# Patient Record
Sex: Male | Born: 1955
Health system: Southern US, Community
[De-identification: ages and names within clinical notes are randomized; demographics above are authoritative.]

## PROBLEM LIST (undated history)

## (undated) DIAGNOSIS — I5020 Unspecified systolic (congestive) heart failure: Secondary | ICD-10-CM

## (undated) DIAGNOSIS — E119 Type 2 diabetes mellitus without complications: Secondary | ICD-10-CM

## (undated) DIAGNOSIS — I2510 Atherosclerotic heart disease of native coronary artery without angina pectoris: Secondary | ICD-10-CM

## (undated) DIAGNOSIS — F419 Anxiety disorder, unspecified: Secondary | ICD-10-CM

## (undated) DIAGNOSIS — I10 Essential (primary) hypertension: Secondary | ICD-10-CM

## (undated) DIAGNOSIS — E7800 Pure hypercholesterolemia, unspecified: Secondary | ICD-10-CM

## (undated) HISTORY — DX: Unspecified systolic (congestive) heart failure: I50.20

## (undated) HISTORY — DX: Pure hypercholesterolemia, unspecified: E78.00

## (undated) HISTORY — DX: Essential (primary) hypertension: I10

## (undated) HISTORY — PX: ARTERIOVENOUS GRAFT PLACEMENT W/ ENDOSCOPIC VEIN HARVEST: SUR1030

## (undated) HISTORY — DX: Morbid (severe) obesity due to excess calories: E66.01

## (undated) HISTORY — DX: Anxiety disorder, unspecified: F41.9

## (undated) HISTORY — DX: Type 2 diabetes mellitus without complications: E11.9

## (undated) HISTORY — DX: Atherosclerotic heart disease of native coronary artery without angina pectoris: I25.10

---

## 2005-02-01 DIAGNOSIS — I2510 Atherosclerotic heart disease of native coronary artery without angina pectoris: Secondary | ICD-10-CM

## 2005-02-01 HISTORY — PX: CORONARY ARTERY BYPASS GRAFT: SHX141

## 2005-02-01 HISTORY — DX: Atherosclerotic heart disease of native coronary artery without angina pectoris: I25.10

## 2005-10-24 ENCOUNTER — Inpatient Hospital Stay (HOSPITAL_COMMUNITY): Admission: EM | Admit: 2005-10-24 | Discharge: 2005-10-26 | Payer: Self-pay | Admitting: Emergency Medicine

## 2005-10-24 ENCOUNTER — Ambulatory Visit: Payer: Self-pay | Admitting: Cardiology

## 2005-10-27 ENCOUNTER — Inpatient Hospital Stay (HOSPITAL_COMMUNITY): Admission: EM | Admit: 2005-10-27 | Discharge: 2005-11-05 | Payer: Self-pay | Admitting: Emergency Medicine

## 2005-10-27 ENCOUNTER — Ambulatory Visit: Payer: Self-pay | Admitting: Internal Medicine

## 2005-10-28 ENCOUNTER — Encounter: Payer: Self-pay | Admitting: Vascular Surgery

## 2005-11-12 ENCOUNTER — Ambulatory Visit: Payer: Self-pay | Admitting: Cardiology

## 2005-11-25 ENCOUNTER — Encounter (HOSPITAL_COMMUNITY): Admission: RE | Admit: 2005-11-25 | Discharge: 2006-02-23 | Payer: Self-pay | Admitting: Cardiology

## 2005-12-08 ENCOUNTER — Ambulatory Visit: Payer: Self-pay | Admitting: Cardiology

## 2006-02-24 ENCOUNTER — Encounter (HOSPITAL_COMMUNITY): Admission: RE | Admit: 2006-02-24 | Discharge: 2006-05-17 | Payer: Self-pay | Admitting: Cardiology

## 2006-03-11 ENCOUNTER — Ambulatory Visit: Payer: Self-pay | Admitting: Cardiology

## 2006-09-22 ENCOUNTER — Ambulatory Visit: Payer: Self-pay | Admitting: Cardiovascular Disease

## 2007-03-06 ENCOUNTER — Ambulatory Visit: Payer: Self-pay | Admitting: Cardiovascular Disease

## 2008-03-11 ENCOUNTER — Ambulatory Visit: Payer: Self-pay | Admitting: Cardiovascular Disease

## 2008-10-10 DIAGNOSIS — I2510 Atherosclerotic heart disease of native coronary artery without angina pectoris: Secondary | ICD-10-CM | POA: Insufficient documentation

## 2008-10-10 DIAGNOSIS — R079 Chest pain, unspecified: Secondary | ICD-10-CM | POA: Insufficient documentation

## 2008-10-10 DIAGNOSIS — E119 Type 2 diabetes mellitus without complications: Secondary | ICD-10-CM | POA: Insufficient documentation

## 2008-10-10 DIAGNOSIS — I10 Essential (primary) hypertension: Secondary | ICD-10-CM | POA: Insufficient documentation

## 2008-10-10 DIAGNOSIS — F411 Generalized anxiety disorder: Secondary | ICD-10-CM | POA: Insufficient documentation

## 2008-10-10 DIAGNOSIS — E7800 Pure hypercholesterolemia, unspecified: Secondary | ICD-10-CM | POA: Insufficient documentation

## 2008-10-11 ENCOUNTER — Ambulatory Visit: Payer: Self-pay | Admitting: Cardiovascular Disease

## 2009-11-06 ENCOUNTER — Ambulatory Visit: Payer: Self-pay | Admitting: Cardiovascular Disease

## 2010-03-03 NOTE — Assessment & Plan Note (Signed)
Summary: yearly/sl   History of Present Illness: Justin Gonzales is seen for F/U of CAD.  He has a previous stent to the RCA and subsequent CABG in 2007 He has not had any recurrent SSCP or SOB.  He has normal LV funciton.  He was asking for nitrospray instead of the pills.  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  He is very well educated and has a business degree from [place].  His psoriasis continues to be horrific and stopping his Plavix has not helped.  He has not had any SSCP, palpitations, SOB, edema or syncope.  He has been compliant with his meds  His BS has been suboptimally controlled due to dietary indiscretion. Since he has brittle DM I think it wojuld be good to do a myovue next year even if he has no symptoms  Current Problems (verified): 1)  Hypertension  (ICD-401.9) 2)  Chest Pain  (ICD-786.50) 3)  Cad  (ICD-414.00) 4)  Hypercholesterolemia  (ICD-272.0) 5)  Dm  (ICD-250.00) 6)  Anxiety  (ICD-300.00)  Current Medications (verified): 1)  Lipitor 80 Mg Tabs (Atorvastatin Calcium) .... Take 1 Tablet By Mouth At Bedtime 2)  Metoprolol Succinate 50 Mg Xr24h-Tab (Metoprolol Succinate) .Marland Kitchen.. 1 Tab By Mouth Once Daily 3)  Novolog Flexpen 100 Unit/ml Soln (Insulin Aspart) .... 40 Units Once Daily 4)  Janumet 50-1000 Mg Tabs (Sitagliptin-Metformin Hcl) .Marland Kitchen.. 1 Tab By Mouth Two Times A Day 5)  Glimepiride 4 Mg Tabs (Glimepiride) .Marland Kitchen.. 1 Tab By Mouth Once Daily 6)  Fosinopril Sodium 40 Mg Tabs (Fosinopril Sodium) .Marland Kitchen.. 1 Tab By Mouth Two Times A Day 7)  Sertraline Hcl 100 Mg Tabs (Sertraline Hcl) .Marland Kitchen.. 1 Tab By Mouth Once Daily  Allergies (verified): No Known Drug Allergies  Past History:  Past Medical History: Last updated: 10/10/2008 Current Problems:  HYPERTENSION (ICD-401.9) CAD (ICD-414.00) stent to RCA and subsequent CABG 2007 HYPERCHOLESTEROLEMIA (ICD-272.0) DM (ICD-250.00) ANXIETY (ICD-300.00)  Past Surgical History: Last updated: 10/10/2008  Coronary artery bypass grafting  x4 2007:  [facility]   OPERATIVE PROCEDURE:  Median sternotomy, xtracorporeal circulation,coronary artery bypass graft surgery x4 using a left internal mammary artery graft to the left anterior descending coronary artery, with a saphenous vein graft to the obtuse marginal branch of the left circumflex coronary artery, and a sequential saphenous vein graft to the posterior descending and posterolateral branches of the right coronary artery.  Endoscopic vein harvesting from the right leg.  Family History: Last updated: 10/10/2008  Mother is 4 and has hypertension and diabetes.  Father  died from cancer.  He has two brothers who do not have coronary disease.  Social History: Last updated: 10/10/2008   He lives here in [place], [place].  He lives  with his wife.  He works at [employer].  He is very active in the  factory. He lifts and pushes things throughout the day.  He denies any  tobacco.  He drinks an occasional beer.  He denies any drug use.  He does  take herbal medications including multivitamin, B complex and some special  complex.  Review of Systems       Denies fever, malais, weight loss, blurry vision, decreased visual acuity, cough, sputum, SOB, hemoptysis, pleuritic pain, palpitaitons, heartburn, abdominal pain, melena, lower extremity edema, claudication, or rash.   Vital Signs:  Patient profile:   55 year old male Height:      74 inches Weight:      284 pounds BMI:  36.60 Pulse rate:   72 / minute Resp:     12 per minute BP sitting:   147 / 90  (left arm)  Vitals Entered By: Kem Parkinson (November 06, 2009 12:07 PM)  Physical Exam  General:  Affect appropriate Healthy:  appears stated age HEENT: normal Neck supple with no adenopathy JVP normal no bruits no thyromegaly Lungs clear with no wheezing and good diaphragmatic motion Heart:  S1/S2 no murmur,rub, gallop or click PMI normal Abdomen: benighn, BS positve, no tenderness, no AAA no bruit.   No HSM or HJR Distal pulses intact with no bruits No edema Neuro non-focal Skin with multiple psoriatic lesions    Impression & Recommendations:  Problem # 1:  HYPERTENSION (ICD-401.9)  Well controlled continue ACE in light of DM His updated medication list for this problem includes:    Metoprolol Succinate 50 Mg Xr24h-tab (Metoprolol succinate) .Marland Kitchen... 1 tab by mouth once daily    Fosinopril Sodium 40 Mg Tabs (Fosinopril sodium) .Marland Kitchen... 1 tab by mouth two times a day  His updated medication list for this problem includes:    Metoprolol Succinate 50 Mg Xr24h-tab (Metoprolol succinate) .Marland Kitchen... 1 tab by mouth once daily    Fosinopril Sodium 40 Mg Tabs (Fosinopril sodium) .Marland Kitchen... 1 tab by mouth two times a day  Problem # 2:  CAD (ICD-414.00)  Stable no angina.  Continue RF modificaiton His updated medication list for this problem includes:    Metoprolol Succinate 50 Mg Xr24h-tab (Metoprolol succinate) .Marland Kitchen... 1 tab by mouth once daily    Fosinopril Sodium 40 Mg Tabs (Fosinopril sodium) .Marland Kitchen... 1 tab by mouth two times a day  His updated medication list for this problem includes:    Metoprolol Succinate 50 Mg Xr24h-tab (Metoprolol succinate) .Marland Kitchen... 1 tab by mouth once daily    Fosinopril Sodium 40 Mg Tabs (Fosinopril sodium) .Marland Kitchen... 1 tab by mouth two times a day  Orders: EKG w/ Interpretation (93000)  Problem # 3:  HYPERCHOLESTEROLEMIA (ICD-272.0) Continue high dose statin Labs per primary His updated medication list for this problem includes:    Lipitor 80 Mg Tabs (Atorvastatin calcium) .Marland Kitchen... Take 1 tablet by mouth at bedtime  Problem # 4:  DM (ICD-250.00) HbA1c quaterly.  F/U primary.  Needs continued dietary vigilance His updated medication list for this problem includes:    Novolog Flexpen 100 Unit/ml Soln (Insulin aspart) .Marland KitchenMarland KitchenMarland KitchenMarland Kitchen 40 units once daily    Janumet 50-1000 Mg Tabs (Sitagliptin-metformin hcl) .Marland Kitchen... 1 tab by mouth two times a day    Glimepiride 4 Mg Tabs (Glimepiride) .Marland Kitchen... 1  tab by mouth once daily    Fosinopril Sodium 40 Mg Tabs (Fosinopril sodium) .Marland Kitchen... 1 tab by mouth two times a day  Patient Instructions: 1)  Your physician recommends that you schedule a follow-up appointment in: YEAR WITH DR Eden Emms 2)  Your physician recommends that you continue on your current medications as directed. Please refer to the Current Medication list given to you today.   EKG Report  Procedure date:  11/06/2009  Findings:      NSR 72 LAE Otherwise normal

## 2010-04-24 ENCOUNTER — Encounter (INDEPENDENT_AMBULATORY_CARE_PROVIDER_SITE_OTHER): Payer: Self-pay | Admitting: *Deleted

## 2010-04-30 NOTE — Letter (Signed)
Summary: Referral - not able to see patient  [facility]  [address]   [place], [place] [address]   Phone: [phone]  Fax: [phone]    April 24, 2010   Re:   Justin Gonzales DOB:  August 25, 1955 MRN:   [id number]    Dear Dr. Nicholos Johns:  Thank you for your kind referral of the above patient.  We have attempted to schedule the recommended procedure colonoscopy but have not been able to schedule because:  _x__ The patient was not available by phone and/or has not returned our calls.  ___ The patient declined to schedule the procedure at this time.  We appreciate the referral and hope that we will have the opportunity to treat this patient in the future.    Sincerely,    [facility] [phone]

## 2010-06-16 NOTE — Assessment & Plan Note (Signed)
[facility]                            CARDIOLOGY OFFICE NOTE   BRODI, KARI                  MRN:          [id number]  DATE:03/06/2007                            DOB:          07-Feb-1955    Mr. Dufault is previously a patient of Dr. Geralynn Rile.  Status post  inferior wall MI with bypass surgery.  He has a bare metal stent in the  right coronary artery.  This was a bridge to bypass.  I believe this was  done in September 2007.   He has not any significant chest pain.  He does get occasional  exertional dyspnea appears functional.  I suspect it is from his weight  gain.  His sugars been somewhat suboptimally controlled.  I suspect his  primary care MD will start lentis insulin next.   REVIEW OF SYSTEMS:  Remarkable for no significant chest pain.  He has  had trace lower extremity edema.  He has been under a lot of stress.  He  is still unemployed.  When he had stressed, his psoriasis breaks out  again and he has had worsening of this on his back.  Otherwise negative.   Meds include Janumet 50/1000 b.i.d. Lipitor or 80 a day, aspirin a day,  lisinopril 40 a day, glyburide 4 the morning 2 at night, metoprolol 50 a  day.   EXAM:  Remarkable for an overweight elderly male, speaking with an  accent.  Affect is appropriate.  Weight 284, blood pressure is 140/80, pulse 18 regular, afebrile,  respiratory 14.  HEENT:  Unremarkable.  Carotids are without bruit, no lymphadenopathy, thyromegaly, JVP  elevation.  Lungs are clear diaphragmatic motion.  No wheezing.  He has multiple psoriatic patches on his back.  S1-S2 normal heart sounds.  PMI normal.  Sternotomy well healed.  ABDOMEN:  Benign.  Bowel sounds positive.  No AAA no tenderness, No  hepatosplenomegaly or hepatojugular reflux.  Distal pulse intact, no edema.  NEURO:  Nonfocal.  SKIN:  Warm and dry.  Status post endoscopic vein harvesting the right  leg.   IMPRESSION:  1.  Coronary disease, previous CABG.  No chest pain.  Continue aspirin      and beta blocker.  2. Hypercholesteremia setting of coronary disease.  Continue Lipitor      80 a day, lipid liver profile in 6 months.  3. Diabetes follow with Dr. Nicholos Johns.  Hemoglobin A1c quarterly.      Suspect given what his fasting blood sugars have been running 120-      130.  He will be on Lantus as next time I see him.  4. Hypertension currently well controlled.  Continue current dose of      lisinopril, low-salt diet.  5. Anxiety related to lack of work, p.r.n. Ativan previously given by      Dr. Samule Ohm.  6. Psoriatic rash.  The patient apparently has medicine that he likes      to use for it.  He      understands it is related to increased stress.  He will follow with  Dr. Salome Holmes, his dermatologist p.r.n.Marland Kitchen     Noralyn Pick. Eden Emms, MD, [facility]  Electronically Signed    PCN/MedQ  DD: 03/06/2007  DT: 03/06/2007  Job #: [id number]
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
## 2010-06-16 NOTE — Assessment & Plan Note (Signed)
[facility]                            CARDIOLOGY OFFICE NOTE   MILAM, ALLBAUGH                  MRN:          [id number]  DATE:03/11/2008                            DOB:          Oct 31, 1955    Mr. Besser seen today in followup.  He is status post previous bare-  metal stenting of the RCA with subsequent CABG by Dr. Laneta Simmers back in  2007.   He has been doing well.  When I last saw him, I stopped his Plavix.   Coronary risk factors include diabetes, hypertension,  hypercholesterolemia.  Since I last saw him, he has been placed on  insulin.  Unfortunately, after one of his insulin shots, he had a  horrendous outbreak of eczema and psoriasis.  He had it in the past, but  it was dormant for 2 years and now it is an extremely bad case and  covering his entire body.  He seems to be taking everything in his  stride. His blood sugar is better controlled on insulin.   He denies any significant chest pain, PND, or orthopnea.  His total  cholesterol has been under 150.   He would like to cut back on his Lipitor from 80 to 40.  He said that  his LFTs were mildly elevated when checked by his primary care MD.  I  think this is reasonable.   His review of systems otherwise negative.   He is allergic to PENICILLIN.   MEDICATIONS:  1. Janumet 50/1000 b.i.d.  2. Lipitor 80 a day.  3. Metoprolol 50 a day.  4. Aspirin a day.  5. Lisinopril 40 b.i.d.  6. Glyburide 5 mg a day.  7. Insulin 40 units.  8. Fish oil.  9. He also uses cider vinegar and garlic.   PHYSICAL EXAMINATION:  GENERAL:  Remarkable for a middle-eastern male in  no distress.  Affect is jovial.  VITAL SIGNS:  Blood pressure is 150/80, pulse 85 and regular, weight  284.  HEENT:  Unremarkable.  NECK:  Carotids are normal without bruit.  No lymphadenopathy or JVP  elevation.  LUNGS:  Clear.  Good diaphragmatic motion.  No wheezing.  CARDIAC:  S1, S2.  Normal heart sounds PMI  normal.  ABDOMEN:  Benign.  Bowel sounds positive.  No AAA, no tenderness, no  bruit, no hepatosplenomegaly, no hepatojugular reflux, or tenderness.  EXTREMITIES:  Distal pulses intact.  No edema.  NEURO:  Nonfocal.  No muscular weakness.  SKIN:  Multiple psoriatic patches particularly a large one on the right  rib cage area, both shins including an area on his scalp.   EKG shows an old inferior wall infarction with no acute changes.   IMPRESSION:  1. Coronary artery disease, previous stent to the right coronary      artery with subsequent coronary artery bypass grafting.  Continue      aspirin therapy.  2. Hypercholesterolemia.  Decrease Lipitor to 40 a day.  Followup      primary care MD for liver function tests.  3. Hypertension, currently well controlled.  Continue current dose of  lisinopril.  4. Diabetes.  Follow up primary care MD.  Hemoglobin A1c target 6.5 or      less, now on insulin.  5. Psoriasis.  Follow up with Dr. Terri Piedra.  I am not sure what else can      be done, but he certainly has a horrific case of psoriasis at this      point.  I do not know that any medicines are contributing to this,      but hopefully there may be some other treatment including UV light      treatment that may be helpful.     Noralyn Pick. Eden Emms, MD, [facility]  Electronically Signed    PCN/MedQ  DD: 03/11/2008  DT: 03/12/2008  Job #: [id number]
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
## 2010-06-19 NOTE — H&P (Signed)
Justin Gonzales, Justin Gonzales         ACCOUNT NO.:  [id number]   MEDICAL RECORD NO.:  [id number]          PATIENT TYPE:  INP   LOCATION:  3703                         FACILITY:  [facility]   PHYSICIAN:  Bevelyn Buckles. Bensimhon, MDDATE OF BIRTH:  26-Oct-1955   DATE OF ADMISSION:  10/27/2005  DATE OF DISCHARGE:                                HISTORY & PHYSICAL   PRIMARY CARE PHYSICIAN:  Dr. Georgianne Fick.   CARDIOLOGIST:  Dr. Randa Evens.   REASON FOR ADMISSION:  Dyspnea and back pain, question unstable angina.   Justin Gonzales is a very pleasant 55 year old male with a history of diabetes  and hypertension.  He was admitted to [facility] on October 24, 2005 with  acute inferior ST elevation myocardial infarction.  He was taken urgently to  the cardiac catheterization lab by Dr. Samule Ohm.  This showed three-vessel  coronary artery disease with an EF of 60%.  Culprit lesion was 95%, mid  right coronary artery, which was angioplastied and stented with a bare-metal  stent with a plan for follow-up CABG.  He was discharged home yesterday  doing well.  However, this afternoon while at rest, he developed some  shortness of breath and back pain.  There was no chest pain.  He says with  his myocardial infarction he did have a significant amount of chest pain.  He was brought to the ER for further evaluation.  In the ER, EKG showed  inferior Q-waves with no acute ST-T wave changes.  Point of care markers had  a negative MB with a troponin of 0.8 which was likely residual from his  previous MI.  He is now symptom free.   REVIEW OF SYSTEMS:  Notable for arthritis pain.  He has not had any bright  red blood per rectum.  No melena.  No other bleeding.  No fevers.  No  chills.  No bowel or bladder changes.  He does have diabetes as stated  above.  Review of systems otherwise negative except for HPI and problem  list.   PAST MEDICAL HISTORY:  1. Coronary artery disease.      a.     Status post  inferior myocardial infarction on October 24, 2005       with PTCA and stenting with a bare-metal stent to 95% mid right       coronary artery.  The remainder of his catheterization showed 30%       ostial left main lesion.  LAD:  First 80% lesion beginning at the       ostium and continuing into the mid vessel crossing the second       diagonal.  The first diagonal had a 95% lesion in it.  Circumflex:       Was a moderate size vessel.  It gave rise to 2 marginals.  The first       marginal was totally occluded at the ostium and filled with left-to-       left collaterals.  RCA:  Had a 95% mid lesion which was stented.  The       PDA had a long 70  to 80% stenosis, and there was also 70% stenosis of       the posterior lateral branch.  EF was 60%.  2. Diabetes.  3. Hypertension.  4. Recent hand burn due to a gas incident at work.   CURRENT MEDICATIONS:  1. Plavix 75 a day.  2. Aspirin 325 a day.  3. Metformin 500 b.i.d.  4. Prinvil 10 mg a day.  5. Lipitor 80 mg a day.   ALLERGIES:  HE IS ALLERGIC TO PENICILLIN.   SOCIAL HISTORY:  He lives here in [place], [place].  He lives  with his wife.  He works at [employer].  He is very active in the  factory. He lifts and pushes things throughout the day.  He denies any  tobacco.  He drinks an occasional beer.  He denies any drug use.  He does  take herbal medications including multivitamin, B complex and some special  complex.   FAMILY HISTORY:  Mother is 84 and has hypertension and diabetes.  Father  died from cancer.  He has two brothers who do not have coronary disease.   PHYSICAL EXAMINATION:  GENERAL:  He is well-appearing in no acute distress.  Respirations are unlabored.  Blood pressure is 126/85 with a heart rate of  80, saturating 100% on room air.  HEENT:  Sclerae anicteric.  EOMI.  There is no xanthelasma.  Mucous  membranes are moist.  Neck is supple.  No JVD.  Carotid 2+ bilateral. No  bruits.  There is no  lymphadenopathy or thyromegaly.  CARDIAC:  He has a regular rate and rhythm.  No murmurs, rubs or gallops.  LUNGS:  Are clear.  ABDOMEN:  Is obese, nontender, nondistended. No hepatosplenomegaly.  No  bruits.  No masses.  Good bowel sounds.  EXTREMITIES:  Warm with no cyanosis, clubbing or edema.  Femoral pulses are  2+ bilaterally without any bruits.  Distal pulses are 2+.  NEUROLOGICAL:  He is alert and oriented x3.  Cranial nerves II-XII are  intact.  Moves all four extremities without difficulty.  Affect is bright.   EKG shows normal sinus rhythm at a rate of 89 with inferior Q-waves.  No  acute ST-T wave changes.  White count is 7.6, hemoglobin is 12.9, platelets  269.  Sodium 133, potassium 4.4, chloride 100, bicarb 26, BUN 14, creatinine  0.9, glucose 304.  Point care markers:  CK-MB is 2.0, troponin 0.83.   ASSESSMENT:  1. Dyspnea and back pain, question unstable angina.  2. Three-vessel coronary artery disease status post recent inferior      myocardial infarction as described above with pending bypass surgery.      EF of 60%.  3. Diabetes.  4. Hypertension.   PLAN/DISCUSSION:  I have discussed the plan with Dr. Samule Ohm.  We will admit  him for rule out myocardial infarction to telemetry.  We will hold Plavix  and asked CVTS to see for bypass surgery.  We will keep him in house on  Lovenox and aspirin until his bypass surgery.  He will be maintained on his  metformin as well as sliding scale insulin for his diabetes, and we will  continue his Lipitor.      Bevelyn Buckles. Bensimhon, MD  Electronically Signed     DRB/MEDQ  D:  10/27/2005  T:  10/29/2005  Job:  [id number]   cc:   Georgianne Fick, M.D.

## 2010-06-19 NOTE — Assessment & Plan Note (Signed)
[facility]                              CARDIOLOGY OFFICE NOTE   Justin Gonzales, Justin Gonzales                  MRN:          [id number]  DATE:12/08/2005                            DOB:          [birth year]    PRIMARY CARE PHYSICIAN:  Georgianne Fick, M.D.   HISTORY OF PRESENT ILLNESS:  Mr. Mehlhoff is a 55 year old gentleman who  suffered inferior myocardial infarction on October 24, 2005.  I placed a  bare-metal stent in his right coronary artery.  He also had severe multi-  vessel coronary disease for which he underwent subsequent coronary artery  bypass grafting by Dr. Laneta Simmers.  Presurgical ejection fraction was normal.  Postoperative course was uncomplicated.   Mr. Bakos is doing nicely postoperatively.  He is not having any  recurrent chest discomfort and is having no exertional dyspnea, PND or  orthopnea.  He says his energy is improving steadily and he is close to his  premorbid baseline.  The peri-incisional discomfort in his chest has  resolved.   His current medications are Janumet 50/1000 one b.i.d., glimepiride 4 mg  twice per day, Lipitor 80 mg once per day, Plavix 75 mg per day, aspirin 325  mg per day, Toprol XL 50 mg per day.   PHYSICAL EXAM:  He is generally well-appearing in no distress with a heart  rate of 80, blood pressure 132/74 and weight of 275 pounds.  Weight is up 7  pounds from early October.  He has no jugular venous distention.  No  thyromegaly.  Lungs are clear to auscultation.  He has nondisplaced point of  maximal cardiac impulse.  There is a regular rate and rhythm without murmur,  rub or gallop.  Mediastinotomy scar is nicely healed.  The abdomen is soft,  nondistended, nontender.  No hepatosplenomegaly.  Bowel sounds are normal.  The extremities are warm and without edema.   IMPRESSION/RECOMMENDATIONS:  1. Coronary disease with prior inferior myocardial infarction:  Doing      nicely after bare-metal  stenting to the right coronary artery and      subsequent coronary artery bypass graft.  Ejection fraction normal.      Continue Toprol XL.  Continue aspirin indefinitely and Plavix for a      total of nine months.  2. Hypertension:  Pretty good control.  He has not restarted the      fosinopril that I had recommended.  We will resume that.  This is      emphasized to him that this is particularly important for protection of      his kidneys in the setting of his diabetes.  3. Diabetes mellitus:  Per Dr. Georgianne Fick.  4. Hypercholesterolemia:  Per Dr. Nicholos Johns.  Goal LDL less than 70.   I will plan on seeing him back in three months' time.     Salvadore Farber, MD  Electronically Signed    WED/MedQ  DD: 12/08/2005  DT: 12/08/2005  Job #: [id number]   cc:   Georgianne Fick, M.D.

## 2010-06-19 NOTE — Discharge Summary (Signed)
Justin Gonzales, Justin Gonzales NO.:  [id number]   MEDICAL RECORD NO.:  [id number]          PATIENT TYPE:  INP   LOCATION:  2032                         FACILITY:  [facility]   PHYSICIAN:  Evelene Croon, M.D.     DATE OF BIRTH:  Jul 24, 1955   DATE OF ADMISSION:  10/27/2005  DATE OF DISCHARGE:  11/04/2005                                 DISCHARGE SUMMARY   PRIMARY DIAGNOSIS:  Severe 3-vessel coronary artery disease status post  acute inferior myocardial infarction.   HOSPITAL DIAGNOSIS:  Postoperative volume overload.   SECONDARY DIAGNOSES:  1. Diabetes mellitus.  2. Hypertension.  3. Recent hand burn due to a gas incident at work.  4. Coronary artery disease status post inferior myocardial infarction on      October 24, 2005 with percutaneous transluminal coronary angioplasty      and stenting with a bare metal stent to 95% mid-right coronary artery.   ALLERGIES:  PENICILLIN.   OPERATIONS AND PROCEDURES:  Coronary artery bypass grafting x4 using a left  internal mammary artery graft to left anterior descending coronary artery,  saphenous vein graft to obtuse marginal branch of left circumflex coronary  artery, sequential saphenous vein graft to posterior descending and  posterior lateral branches of the right coronary artery.  Endoscopic vein  harvesting from right leg.   HISTORY AND PHYSICAL AND HOSPITAL COURSE:  The patient is a 55 year old  __________ who was admitted with an acute inferior myocardial infarction  about 1 week ago.  His peak CPK was 779 with an MB of 132 and troponin of  24.  Culprit was a 95% mid-right coronary stenosis in a large dominant  vessel.  This was treated with a bare metal stent by Dr. Samule Ohm.  The  patient also had severe 3-vessel disease with a long proximal 80% LAD  stenosis and diffuse distal LAD disease.  There was 100% obtuse marginal  occlusion with collaterals filling the vessel.  The right coronary also had  70% distal stenosis  and there was also about 70% stenosis of the proximal  portion of posterior descending artery.  Ejection fraction was seen to be  60%.  Initial plan was to keep him on Plavix for 2 weeks and then stop it  for a week and do coronary artery bypass grafting.  But the patient was  readmitted to [facility] with shortness of breath without chest  pain.  He ruled out for a myocardial infarction and had no acute EKG  changes.  The patient was seen and evaluated by Dr. Laneta Simmers.  It was felt  that the patient should remain in the hospital and Plavix should be stopped  and should be washed out for a 5 day period.  Dr. Laneta Simmers saw and evaluated  the patient.  He discussed this with the patient and then proceeding with  coronary artery bypass grafting.  He discussed risks and benefits.  The  patient acknowledged understanding and agreed to proceed.  The patient had  bilateral carotid duplex ultrasound done showing no significant ICA  stenosis.  He has had bilateral ABIs done which showed bilateral greater  than 1.  Surgery was scheduled for November 01, 2005.  For details of the  patient's past medical history and physical exam, please see dictated  history and physical.   The patient was taken to the operating room on November 01, 2005, where he  underwent coronary artery bypass grafting x4 using a left internal mammary  artery graft to left anterior descending coronary artery, saphenous vein  graft to obtuse marginal branch of the left circumflex coronary artery,  sequential saphenous vein graft to posterior descending fistula branches of  the right coronary artery.  Endoscopic vein harvesting from right leg was  done.  The patient tolerated this procedure well and was transferred up to  the Intensive Care Unit in stable condition.  Following surgery, the patient  was deemed to be hemodynamically stable.  He was extubated late evening  early morning following surgery.  The patient's postoperative  course was  pretty much unremarkable.  Chest tubes and strips were discontinued postop  day #1.  The patient was transferred out to 2000 postop day #1.  Vital signs  were monitored during his postoperative course and seemed to be stable.  He  was afebrile.  The patient was able to be weaned off oxygen sating greater  than 90% on room air.  The patient remained hemodynamically stable  postoperatively.  The last hemoglobin and hematocrit were 10.7 and 30 postop  day #2.  This remained stable.  The patient did develop volume overload  postoperatively and was started on diuretics.  He was back near his baseline  prior to discharge home.  The patient was out of bed ambulating well.  He  was tolerating a regular diet well.  No nausea, vomiting noted.  The patient  remained in normal sinus rhythm postoperatively.  Lungs are clear to  auscultation bilaterally.  Incisions are clean, dry and intact and healing  well.  The patient does have a history of diabetes mellitus and his blood  sugars were monitored.  Initially, he was placed on Lantus until tolerating  a regular diet well.  By postop day #4, the patient was continued on the  Amaryl and restarted on his Janumet.  Blood sugars remained stable and  Lantus was able to be discontinued.  The patient is tentatively ready for  discharge home postop day #5, November 05, 2005.   FOLLOW-UP APPOINTMENTS:  Our office will contact the patient with a follow-  up appointment with Dr. Laneta Simmers for in 3 weeks.  The patient will need to  call Dr. Melinda Crutch office to schedule follow-up appointment with him in 2  weeks.   ACTIVITY:  The patient was instructed no driving until released to do so, no  lifting over 10 pounds.  He is told to ambulate 3-4 times per day, progress  as tolerated.  Continue his breathing exercises.   INCISIONAL CARE:  The patient was told he is allowed to shower, washing his incisions using soap and water.  He is to contact the office if he  develops  any drainage or opening from any of his incision sites.   DISCHARGE DIET:  The patient was educated on diet to be low-fat, low-salt.   DISCHARGE MEDICATIONS:  1. Aspirin 325 mg daily.  2. Toprol XL 25 mg daily.  3. Lisinopril 10 mg daily.  4. Lipitor 80 mg at night.  5. Amaryl 4 mg daily.  6. Xanax 75 mg p.r.n. daily.  7. Plavix 75 mg daily.  8. Lasix 40 mg  daily x5 days.  9. Potassium chloride 20 mEq daily x5 days.  10.Janumet 50/1000 mg b.i.d.  11.Oxycodone 5 mg one to two tabs q.4-6h. p.r.n. pain.      Theda Belfast, [place]      Evelene Croon, M.D.  Electronically Signed    KMD/MEDQ  D:  11/04/2005  T:  11/05/2005  Job:  [id number]   cc:   Evelene Croon, M.D.  Salvadore Farber, MD

## 2010-06-19 NOTE — Cardiovascular Report (Signed)
Justin Gonzales, Justin Gonzales NO.:  [id number]   MEDICAL RECORD NO.:  [id number]          PATIENT TYPE:  INP   LOCATION:  2807                         FACILITY:  [facility]   PHYSICIAN:  Salvadore Farber, MD  DATE OF BIRTH:  03/15/1955   DATE OF PROCEDURE:  10/24/2005  DATE OF DISCHARGE:                              CARDIAC CATHETERIZATION   PROCEDURE:  1. Left heart catheterization.  2. Left ventriculography.  3. Coronary angiography.  4. Bare metal stent placement in the mid-right coronary artery.  5. Aspiration thrombectomy.   INDICATION:  Mr. Levester Fresh is a 55 year old gentleman with diabetes  mellitus.  He has been suffering exertional chest discomfort for several  months.  Tonight he awoke with substernal chest discomfort.  He activated  EMS.  Pain improved with aspirin and sublingual nitroglycerin en route.  Electrocardiogram demonstrated approximately 1 mm of ST elevation in lead  III alone.  Cath lab was activated via EMS.  On the patient's arrival to the  hospital, he had 1-2/10 substernal chest discomfort with no change in his  electrocardiogram.  We decided to proceed to catheterization and possible  percutaneous revascularization.   PROCEDURAL TECHNIQUE:  Informed consent was obtained.  Under 1% lidocaine  local anesthesia, a 6-French sheath was placed in the right common femoral  artery using the modified Seldinger technique.  Diagnostic angiography and  ventriculography were performed using JL-4, JR-4, and pigtail catheters.  This demonstrated multivessel coronary disease with culprit lesion being a  95% stenosis of the mid-right coronary artery.  Decision was made to proceed  to percutaneous revascularization of this culprit stenosis with subsequent  consideration of CABG versus multivessel PCI for his other stenoses.   Anticoagulation was initiated with heparin, double bolus eptifibatide, and  champion study drug.  ACT was confirmed to be greater than  200 seconds and  was maintained such.  I began with a 6-French JR-4 guide.  I advanced a  Prowater wire across the lesion with mild difficulty.  I was then unable to  advance a 3.0 mm Maverick balloon across the lesion.  There was poor guide  support.  I therefore withdrew the wire and guide and upsized the sheath to  7-French.  I then advanced a 7-French ART 4.0 guide over the wire, engaging  the ostium of the right coronary.  I readvanced the Prowater across the  lesion without difficulty.  I was then able to advance a 2.0 x 9 mm Maverick  across the lesion without difficulty.  I inflated it to 6 atmospheres.  I  then readvanced the 3.0 x 12 mm Maverick across the lesion and inflated it  to 6 atmospheres.  I then stented the lesion using a 3.5 x 16 mm Liberte  stent deployed at 16 atmospheres.  I then postdilated the proximal portion  of the stent using a 4.0 x 12 mm Quantum at 16 atmospheres.  There appeared  to be a thrombus just distal to the stented segment.  I advanced an  aspiration thrombectomy catheter through this region for a single pass.  This resolved the thrombus.  At that time, ACT was found  to be 191, and  additional heparin was given.  I then further postdilated the stent using a  4.5 x 12 mm Quantum at 16 atmospheres.  Final angiography demonstrated  approximately 10% residual stenosis, no dissection, and TIMI III flow to the  distal vasculature.   The arteriotomy was then closed using an 8-French AngioSeal device.  Complete hemostasis was obtained.  He was then transferred to the cardiac  intensive care unit in stable condition.   COMPLICATIONS:  None.   FINDINGS:  1. LV:  133/20/31.  EF 60% with apical akinesis and severe hypokinesis of      the entirety of the inferior wall.  2. Left main:  Diffuse mild disease with a 30% ostial stenosis.  3. LAD:  Fairly short vessel which does not reach the apex of the heart.      It gives rise to 3 small diagonals.  The first  diagonal has a 95%      stenosis.  There is a long segment of 80% stenosis of the LAD beginning      with the ostium and continuing across the second diagonal.  The distal      LAD has 80% stenosis diffusely.  4. Ramus intermedius:  Long with small diameter vessel without focal      stenosis.  5. Circumflex:  Moderate-sized vessel giving rise to 2 marginals.  The      first marginal is occluded at its ostium and fills via left-to-left      collaterals.  6. RCA:  Extremely large dominant vessel.  There was a 95% stenosis of the      midvessel.  This was stented to no residual.  There is a 70-80%      stenosis of the proximal PDA and a long 70% stenosis of the posterior      left ventricular branch.   IMPRESSION/RECOMMENDATION:  The patient has multivessel coronary disease.  The culprit lesion in the right coronary artery was treated successfully  treated with bare metal stenting.  He has been initiated on Plavix as part  of his champion study.  I will refer him to cardiac surgery for  consideration of coronary bypass grafting.  We will need to consider the  Plavix load administered considering timing of surgical revascularization.  We will treat him with aspirin, beta-blocker, ACE inhibitor, and continued  statin.      Salvadore Farber, MD  Electronically Signed     WED/MEDQ  D:  10/24/2005  T:  10/26/2005  Job:  [id number]   cc:   Georgianne Fick, M.D.

## 2010-06-19 NOTE — Assessment & Plan Note (Signed)
[facility]                              CARDIOLOGY OFFICE NOTE   Justin Gonzales, Justin Gonzales                  MRN:          [id number]  DATE:11/12/2005                            DOB:          [birth year]    HISTORY OF PRESENT ILLNESS:  Justin Gonzales is a 55 year old gentleman who  presented with inferior myocardial infarction on October 24, 2005.  I  placed a bare metal stent in his right coronary artery.  He also had severe  multivessel disease and subsequently underwent coronary artery bypass  grafting by Dr. Laneta Simmers.  Pre-surgical ejection fraction was normal.  Postoperative course was uncomplicated.  He has generally been doing well  since discharge.  He has resumed his ACE inhibitor on his own and increased  his beta blocker because his blood pressure had been running high.  His  angina has been completely cured.  He has not had any orthopnea or PND.  He  does have some mild peri-incisional discomfort in the chest and requested a  refill of his oxycodone for a few more days.   CURRENT MEDICATIONS:  1. Janumet 50/1000 one b.i.d.  2. Glimepiride 4 mg twice per day.  3. Lipitor 80 mg per day.  4. Toprol XL 25 mg, taking once or twice a day depending on his blood      pressure.  5. Plavix 75 mg per day.  6. Aspirin 325 mg per day.  7. Lisinopril 20 mg twice per day.   PHYSICAL EXAMINATION:  GENERAL:  Well appearing, in no acute distress.  VITAL SIGNS: Heart rate 101, blood pressure 150/95, weight 268 pounds.  He  assures me that blood pressures have generally been in the range of 122 to  135/80.  NECK:  No jugular venous distention, no thyromegaly.  CHEST: Median sternotomy scar is healing nicely.  There is an area of mild  erythema in the mid section of the incision.  There is no tenderness at this  site and no purulence.  Suture line remains intact .  LUNGS: Clear to auscultation.  Respiratory effort is normal.  CARDIAC:  He has  nondisplaced point of maximal cardiac impulse.  There is  regular rate and rhythm without murmur, rub, or gallop.  ABDOMEN: Soft, nondistended, nontender.  There is no hepatosplenomegaly.  Bowel sounds are normal.  EXTREMITIES: Warm without edema.  Saphenous vein harvest site looks good.   Sinus tachycardia at 100 beats per minute with borderline left atrial  enlargement and inferior Q waves.   IMPRESSION AND RECOMMENDATIONS:  1. Coronary disease: Doing nicely after inferior myocardial infarction      with bare metal stenting to the right coronary and subsequently      coronary artery bypass grafting.  Ejection fraction is preserved.  Will      increase Toprol XL to stable dose of 50 mg per day; continue aspirin      indefinitely and Plavix for a year.  2. Hypertension: Continue ACE inhibitor.  3. Diabetes mellitus: Per Dr. Nicholos Johns.  4. Hypercholesterolemia: Per Dr. Nicholos Johns.  Goal LDL less than 70.   I will plan  on seeing him back in a month's time.       Justin Farber, MD     WED/MedQ  DD:  11/12/2005  DT:  11/14/2005  Job #:  [id number]   cc:   Evelene Croon, M.D.  Georgianne Fick, M.D.

## 2010-06-19 NOTE — Assessment & Plan Note (Signed)
[facility]                            CARDIOLOGY OFFICE NOTE   Justin Gonzales, Justin Gonzales                  MRN:          [id number]  DATE:03/11/2006                            DOB:          July 22, 1955    PRIMARY CARE PHYSICIAN:  Dr. Georgianne Gonzales.   HISTORY OF PRESENT ILLNESS:  Justin Gonzales is a 55 year old gentleman  who suffered inferior myocardial infarction in September 2007.  I  bridged him to coronary artery bypass grafting by placing a bare metal  stent in his right coronary artery.  His ejection fraction is normal.  He has done nicely after his bypass surgery.  He is back at work now.  He passed his citizenship exam this week, about which he is quite proud.  Work is going well.  He does continue to complain of some anxiety and  request a refill on his Ativan to bridge him until his appointment with  Dr. Nicholos Gonzales.   CURRENT MEDICATIONS:  1. Janumet 50/1000 one b.i.d.  2. Glimepiride 4 mg twice per day.  3. Lipitor 80 mg per day.  4. Plavix 75 mg per day.  5. Aspirin 325 mg per day.  6. Toprol XL 50 mg per day.  7. Fosinopril 40 mg per day.  8. Ativan 1 mg once per day.   PHYSICAL EXAMINATION:  He was generally well-appearing, in no distress,  with heart rate 79, blood pressure 130/92, and weight of 267 pounds.  Weight is down 8 pounds from November.  He has no jugular venous distension, thyromegaly, or lymphadenopathy.  Lungs are clear to auscultation.  Respiratory effort is normal.  He has a nondisplaced point of maximal cardiac impulse.  There is a  regular rate and rhythm without murmur, rub, or gallop.  Median  sternotomy scar is nicely healed.  The abdomen is soft, nondistended, nontender.  There is  hepatosplenomegaly.  Bowel sounds are normal.  The extremities are warm and without edema.   Electrocardiogram demonstrates normal sinus rhythm with inferior  infarct.  No change from prior.    IMPRESSION/RECOMMENDATIONS:  1. Coronary disease, prior inferior infarct:  Doing nicely after bare      metal stenting in the right coronary artery, and subsequent bypass      grafting.  Ejection fraction normal.  Continue aspirin      indefinitely, and Plavix until June.  Continue angiotensin-      converting enzyme inhibitor and beta blocker.  2. Hypertension:  Elevated today.  However, patient tells me that his      blood pressure is usually 115-125 systolic with diastolics usually      in the 60s at home.  He checks it several times daily.  We will,      thus, make no changes in his medication.  3. Diabetes mellitus:  Per Dr. Nicholos Gonzales.  4. Hypercholesterolemia:  Per Dr. Nicholos Gonzales.  Goal LDL less than      70.  5. Anxiety:  Will refill his Ativan for 30 days only.  Will leave      further management of his anxiety to Dr. Nicholos Gonzales.  Justin Farber, MD  Electronically Signed    WED/MedQ  DD: 03/11/2006  DT: 03/11/2006  Job #: [id number]   cc:   Justin Gonzales, M.D.
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
## 2010-06-19 NOTE — Op Note (Signed)
Justin Gonzales, Justin Gonzales         ACCOUNT NO.:  [id number]   MEDICAL RECORD NO.:  [id number]          PATIENT TYPE:  INP   LOCATION:  2315                         FACILITY:  [facility]   PHYSICIAN:  Evelene Croon, M.D.     DATE OF BIRTH:  1955-02-26   DATE OF PROCEDURE:  11/01/2005  DATE OF DISCHARGE:                                 OPERATIVE REPORT   PREOPERATIVE DIAGNOSIS:  Severe three-vessel coronary artery disease status  post acute inferior myocardial infarction.   POSTOPERATIVE DIAGNOSIS:  Severe three-vessel coronary artery disease status  post acute inferior myocardial infarction.   OPERATIVE PROCEDURE:  Median sternotomy, extracorporeal circulation,  coronary artery bypass graft surgery x4 using a left internal mammary artery  graft to the left anterior descending coronary artery, with a saphenous vein  graft to the obtuse marginal branch of the left circumflex coronary artery,  and a sequential saphenous vein graft to the posterior descending and  posterolateral branches of the right coronary artery.  Endoscopic vein  harvesting from the right leg.   ATTENDING SURGEON:  Evelene Croon, M.D.   ASSISTANT:  Salvatore Decent. Cornelius Moras, M.D.   SECOND ASSISTANT:  Ammie Ferrier [facility]   ANESTHESIA:  General endotracheal.   CLINICAL HISTORY:  This patient is a 55 year old [place] gentleman who was  admitted with an acute inferior MI about 1 week ago.  His peak CPK was 779  with an MB 132 and troponin of 24.  The culprit was a 95% mid right coronary  stenosis in a large dominant vessel.  This was treated with a bare metal  stent by Dr. Randa Evens.  The patient also had severe three-vessel  disease with a long proximal 80% LAD stenosis and diffuse distal LAD  disease.  There was 100% obtuse marginal occlusion with collaterals filling  the vessel.  The right coronary artery also had 70% distal stenosis and  there was also about 70% stenosis of the proximal portion of the posterior  descending artery.  An ejection fraction was about 60%.  The initial plan  was to keep him on Plavix for 2 weeks and then stop it for a week and do  bypass surgery but he was readmitted with shortness of breath without chest  pain.  He ruled out for myocardial infarction and had no acute EKG changes.  It was felt that he should remain in hospital.  His Plavix was stopped and  he was treated with Integrilin.  After a 5-day period of Plavix washout he  was scheduled for bypass surgery.  I discussed the operative procedure with  the patient and his wife and daughter including alternatives, benefits, and  risks including bleeding, blood transfusion, infection, stroke, myocardial  infarction, graft failure, and death.  He understood and agreed to proceed.   OPERATIVE PROCEDURE:  The patient was taken to operating room, placed on  table in supine position.  After induction of general endotracheal  anesthesia a Foley catheter was placed in bladder in sterile technique.  Then the chest, abdomen and both lower extremities were prepped and draped  in usual sterile manner.  The chest was entered through  a median sternotomy  incision.  The pericardium opened in the midline.  Examination of heart  showed good ventricular contractility.  The ascending aorta had no palpable  plaques in it.   Then the left internal mammary artery was harvested from the chest wall as  pedicle graft.  This is a medium caliber vessel with excellent blood flow  through it.  At the same time segment of greater saphenous vein was  harvested from the right leg.  This was harvested endoscopically and was a  large caliber vein with good quality.   Then the patient was heparinized and when an adequate activated clotting  time was achieved.  The distal ascending aorta was cannulated using a 22-  French aortic cannula for arterial inflow.  Venous outflow was achieved  using a two-stage venous cannula through the right atrial  appendage.  An  antegrade cardioplegia and vent cannula was inserted in aortic root.   The patient placed on cardiopulmonary bypass and distal coronaries  identified.  The LAD was intramyocardial in its proximal half.  It exited  from the muscle at the junction of mid and distal third where it was heavily  diseased probably corresponding to the diseased distal segment noted on  angiogram.  This vessel was traced backwards into the muscle where it became  a medium size vessel with minimal disease in it.  The diagonal branches were  all small not graftable vessels.  The left circumflex system had a single  moderate-sized marginal branch that was diffusely diseased.  The right  coronary artery gave off a large posterior descending branch which had  fairly high takeoff and then a smaller second branch that was lying beneath  the posterior descending vein.  There was a large posterolateral branch.  There is evidence of previous inferior infarction with some scar present.   Then the aorta was crossclamped and 1000 mL of cold blood antegrade  cardioplegia was administered in the aortic root with quick arrest of the  heart.  Systemic hypothermia to 28 degrees centigrade and topical  hypothermia with iced saline was used.  A temperature probe placed in the  septum and insulating pad in the pericardium.   The first distal anastomosis was performed of posterior descending coronary.  The internal diameter was about 1.75 mm.  Conduit used was a segment of  greater saphenous vein and anastomosis performed in a sequential side-to-  side manner using continuous 7-0 Prolene suture.  Flow was noted through the  graft and was excellent.   The second distal anastomosis was performed to the posterolateral branch.  The internal diameter was also about 1.75 mm.  Conduit used was the same  segment of greater saphenous vein and anastomosis performed in a sequential end-to-side manner using continuous 7-0  Prolene suture.  Flow was noted  through the graft and was excellent.  Then dose of cardioplegia given down  vein graft and in the aortic root.   The third distal anastomosis was performed to the obtuse marginal branch.  The internal diameter was about 1.5 mm.  The conduit used was a second  segment of greater saphenous vein and the anastomosis performed in end-to-  side manner using continuous 7-0 Prolene suture.  Flow was noted through the  graft and was excellent.   Fourth distal anastomosis was performed to the midportion of the left  anterior descending coronary artery.  The internal diameter was about 1.75  mm.  The conduit used was the left internal  mammary graft and this was  brought through an opening in the left pericardium anterior to the phrenic  nerve.  It was anastomosed to the LAD in end-to-side manner using continuous  8-0 Prolene suture.  The pedicle was sutured to the epicardium with 6-0  Prolene sutures.  The patient rewarmed to 37 degrees centigrade.  With  crossclamp in place the two proximal vein graft anastomoses performed to  aortic root end-to-side manner using continuous 6-0 Prolene suture.  The  clamp was then removed from the mammary pedicle.  There is rapid rewarming  of the ventricular septum and return of spontaneous ventricular  fibrillation.  The crossclamp was removed with a time of 88 minutes.  The  patient defibrillated to sinus rhythm.   The proximal and distal anastomoses appeared hemostatic and line of grafts  satisfactory.  Graft markers placed around the proximal anastomoses.  Two  temporary right ventricular and right atrial pacing wires placed and brought  out through the skin.   The patient rewarmed 37 degrees centigrade, he was weaned from  cardiopulmonary bypass on no inotropic agents.  Total bypass time was 107  minutes.  Cardiac function appeared excellent.  Cardiac output of 5.5 liters  per minute.  Protamine was given and the venous  and aortic cannuli removed  without difficulty.  Hemostasis was achieved.  Three chest tubes were placed  with two in the posterior pericardium, one in left pleural space, and one in  anterior mediastinum.  The pericardium was reapproximated over the heart.  Sternum was closed with #6 stainless steel wires.  Fascia was closed with  continuous #1 Vicryl suture.  Subcutaneous tissue was closed with continuous  2-0 Vicryl and the skin with 3-0 Vicryl subcuticular closure.  Lower  extremity vein harvest site was closed in layers in similar manner.  Sponge,  needle and instrument counts correct according to scrub nurse.  Dry sterile  dressing applied over the incisions, around the chest tubes which were  hooked to Pleur-Evac suction.  The patient remained hemodynamically stable,  transferred to the SICU in guarded but stable condition.      Evelene Croon, M.D.  Electronically Signed     BB/MEDQ  D:  11/01/2005  T:  11/02/2005  Job:  [id number]  cc:   Salvadore Farber, MD

## 2010-10-30 ENCOUNTER — Encounter: Payer: Self-pay | Admitting: Cardiovascular Disease

## 2010-11-02 ENCOUNTER — Encounter: Payer: Self-pay | Admitting: Cardiovascular Disease

## 2010-11-02 ENCOUNTER — Ambulatory Visit (INDEPENDENT_AMBULATORY_CARE_PROVIDER_SITE_OTHER): Payer: BC Managed Care – PPO | Admitting: Cardiovascular Disease

## 2010-11-02 DIAGNOSIS — E7800 Pure hypercholesterolemia, unspecified: Secondary | ICD-10-CM

## 2010-11-02 DIAGNOSIS — E119 Type 2 diabetes mellitus without complications: Secondary | ICD-10-CM

## 2010-11-02 DIAGNOSIS — I10 Essential (primary) hypertension: Secondary | ICD-10-CM

## 2010-11-02 DIAGNOSIS — I2510 Atherosclerotic heart disease of native coronary artery without angina pectoris: Secondary | ICD-10-CM

## 2010-11-02 NOTE — Assessment & Plan Note (Signed)
Target A1C 6.5 or less with known CAD.  Low carb diet

## 2010-11-02 NOTE — Assessment & Plan Note (Signed)
Cholesterol is at goal.  Continue current dose of statin and diet Rx.  No myalgias or side effects.  F/U  LFT's in 6 months. No results found for this basename: LDLCALC             

## 2010-11-02 NOTE — Patient Instructions (Signed)
Your physician wants you to follow-up in: 1 year with Dr. Nishan.  You will receive a reminder letter in the mail two months in advance. If you don't receive a letter, please call our office to schedule the follow-up appointment.  

## 2010-11-02 NOTE — Assessment & Plan Note (Signed)
Stable with no angina and good activity level.  Continue medical Rx CABG in 2007

## 2010-11-02 NOTE — Progress Notes (Signed)
Justin Gonzales is seen for F/U of CAD. He has a previous stent to the RCA and subsequent CABG in 2007  He has not had any recurrent SSCP or SOB. He has normal LV funciton. He was asking for nitrospray instead of the pills. 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 He is very well educated and has a business degree from [place]. His psoriasis continues to be horrific and stopping his Plavix has not helped. He has not had any SSCP, palpitations, SOB, edema or syncope. He has been compliant with his meds His BS has been suboptimally controlled due to dietary indiscretion.  DM better than last year with insulin.     ROS: Denies fever, malais, weight loss, blurry vision, decreased visual acuity, cough, sputum, SOB, hemoptysis, pleuritic pain, palpitaitons, heartburn, abdominal pain, melena, lower extremity edema, claudication, or rash.  All other systems reviewed and negative  General: Affect appropriate Healthy:  appears stated age HEENT: normal Neck supple with no adenopathy JVP normal no bruits no thyromegaly Lungs clear with no wheezing and good diaphragmatic motion Heart:  S1/S2 no murmur,rub, gallop or click PMI normal Abdomen: benighn, BS positve, no tenderness, no AAA no bruit.  No HSM or HJR Distal pulses intact with no bruits No edema Neuro non-focal Skin warm and dry marked psoriasis over entire upper body No muscular weakness   Current Outpatient Prescriptions  Medication Sig Dispense Refill  . atorvastatin (LIPITOR) 80 MG tablet Take 80 mg by mouth daily.        . fosinopril (MONOPRIL) 40 MG tablet Take 40 mg by mouth daily.        Marland Kitchen glimepiride (AMARYL) 4 MG tablet Take 4 mg by mouth daily before breakfast.        . insulin aspart (NOVOLOG FLEXPEN) 100 UNIT/ML injection Inject 40 Units into the skin daily.        . metoprolol (TOPROL-XL) 50 MG 24 hr tablet Take 50 mg by mouth daily.        . nitroGLYCERIN (NITROLINGUAL) 0.4 MG/SPRAY spray Place 1 spray under the tongue every 5  (five) minutes as needed.        . sertraline (ZOLOFT) 100 MG tablet Take 100 mg by mouth daily.        . sitaGLIPtan-metformin (JANUMET) 50-1000 MG per tablet Take 1 tablet by mouth 2 (two) times daily with a meal.          Allergies  Penicillins  Electrocardiogram:  NSR 84 LAE otherwise normal ECG  Assessment and Plan

## 2010-11-02 NOTE — Assessment & Plan Note (Signed)
Well controlled.  Continue current medications and low sodium Dash type diet.    

## 2010-12-26 ENCOUNTER — Other Ambulatory Visit: Payer: Self-pay | Admitting: Cardiovascular Disease

## 2011-02-15 ENCOUNTER — Other Ambulatory Visit: Payer: Self-pay

## 2011-02-15 ENCOUNTER — Other Ambulatory Visit: Payer: Self-pay | Admitting: Cardiovascular Disease

## 2011-02-15 MED ORDER — METOPROLOL SUCCINATE ER 50 MG PO TB24: 50.0000 mg | ORAL_TABLET | Freq: Every day | ORAL | Status: DC

## 2011-11-08 ENCOUNTER — Other Ambulatory Visit: Payer: Self-pay | Admitting: *Deleted

## 2011-11-08 MED ORDER — NITROGLYCERIN 0.4 MG/SPRAY TL SOLN: 1.0000 | Status: DC | PRN

## 2012-01-17 ENCOUNTER — Other Ambulatory Visit: Payer: Self-pay | Admitting: Cardiovascular Disease

## 2012-01-17 MED ORDER — METOPROLOL SUCCINATE ER 50 MG PO TB24: 50.0000 mg | ORAL_TABLET | Freq: Every day | ORAL | Status: DC

## 2012-01-20 ENCOUNTER — Other Ambulatory Visit: Payer: Self-pay | Admitting: Cardiovascular Disease

## 2012-01-20 MED ORDER — ATORVASTATIN CALCIUM 80 MG PO TABS: 80.0000 mg | ORAL_TABLET | Freq: Every day | ORAL | Status: DC

## 2012-01-21 ENCOUNTER — Telehealth: Payer: Self-pay | Admitting: Cardiovascular Disease

## 2012-01-21 NOTE — Telephone Encounter (Signed)
Pt needs refill on metaprolol called into [facility] on [address] and they have a question regarding medication so she needs a call

## 2012-01-21 NOTE — Telephone Encounter (Signed)
spoke with pt she asked for more refill of metoprolol  till her appointment  i called it into the pharmacy

## 2012-02-22 ENCOUNTER — Ambulatory Visit (INDEPENDENT_AMBULATORY_CARE_PROVIDER_SITE_OTHER): Payer: PRIVATE HEALTH INSURANCE | Admitting: Cardiovascular Disease

## 2012-02-22 VITALS — BP 140/80 | HR 70 | Ht 73.0 in | Wt 304.0 lb

## 2012-02-22 DIAGNOSIS — R079 Chest pain, unspecified: Secondary | ICD-10-CM

## 2012-02-22 DIAGNOSIS — E119 Type 2 diabetes mellitus without complications: Secondary | ICD-10-CM

## 2012-02-22 DIAGNOSIS — I10 Essential (primary) hypertension: Secondary | ICD-10-CM

## 2012-02-22 DIAGNOSIS — I2510 Atherosclerotic heart disease of native coronary artery without angina pectoris: Secondary | ICD-10-CM

## 2012-02-22 DIAGNOSIS — I25810 Atherosclerosis of coronary artery bypass graft(s) without angina pectoris: Secondary | ICD-10-CM

## 2012-02-22 DIAGNOSIS — E7800 Pure hypercholesterolemia, unspecified: Secondary | ICD-10-CM

## 2012-02-22 MED ORDER — METOPROLOL SUCCINATE ER 50 MG PO TB24: 50.0000 mg | ORAL_TABLET | Freq: Every day | ORAL | Status: DC

## 2012-02-22 MED ORDER — ATORVASTATIN CALCIUM 80 MG PO TABS: 80.0000 mg | ORAL_TABLET | Freq: Every day | ORAL | Status: DC

## 2012-02-22 NOTE — Progress Notes (Signed)
Patient ID: Justin Gonzales, male   DOB: 11-22-1955, 57 y.o.   MRN: [id number] [place] is seen for F/U of CAD. He has a previous stent to the RCA and subsequent CABG in 2007  He has not had any recurrent SSCP or SOB. He has normal LV funciton. He was asking for nitrospray instead of the pills. 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 He is very well educated and has a business degree from [place]. His psoriasis continues to be horrific and stopping his Plavix has not helped. He has not had any , palpitations, SOB, edema or syncope. He has been compliant with his meds His BS has been suboptimally controlled due to dietary indiscretion. DM better than last year with insulin.   Having some exertional chest pain.  Radiates to right arm  Not all the time ntermitant.  Gained 10 lbs and has not worked out for 8 weeks No rest pain.  Has not taken nitro  Brother died unexpectedly  ROS: Denies fever, malais, weight loss, blurry vision, decreased visual acuity, cough, sputum, SOB, hemoptysis, pleuritic pain, palpitaitons, heartburn, abdominal pain, melena, lower extremity edema, claudication, or rash.  All other systems reviewed and negative  General: Affect appropriate Overweight [place] male HEENT: normal Neck supple with no adenopathy JVP normal no bruits no thyromegaly Lungs clear with no wheezing and good diaphragmatic motion Heart:  S1/S2 no murmur, no rub, gallop or click PMI normal Abdomen: benighn, BS positve, no tenderness, no AAA no bruit.  No HSM or HJR Distal pulses intact with no bruits No edema Neuro non-focal Skin warm and dry No muscular weakness   Current Outpatient Prescriptions  Medication Sig Dispense Refill  . atorvastatin (LIPITOR) 80 MG tablet Take 1 tablet (80 mg total) by mouth at bedtime.  30 tablet  1  . fosinopril (MONOPRIL) 40 MG tablet Take 40 mg by mouth daily.        Marland Kitchen glimepiride (AMARYL) 4 MG tablet Take 4 mg by mouth daily before breakfast.        .  insulin aspart (NOVOLOG FLEXPEN) 100 UNIT/ML injection Inject 40 Units into the skin daily.        . metoprolol succinate (TOPROL-XL) 50 MG 24 hr tablet Take 1 tablet (50 mg total) by mouth daily.  30 tablet  0  . nitroGLYCERIN (NITROLINGUAL) 0.4 MG/SPRAY spray Place 1 spray under the tongue every 5 (five) minutes as needed.  12 g  2  . sertraline (ZOLOFT) 100 MG tablet Take 100 mg by mouth daily.        . sitaGLIPtan-metformin (JANUMET) 50-1000 MG per tablet Take 1 tablet by mouth 2 (two) times daily with a meal.          Allergies  Penicillins  Electrocardiogram: NSR rate 70 normal ECG  Assessment and Plan

## 2012-02-22 NOTE — Assessment & Plan Note (Signed)
Cholesterol is at goal.  Continue current dose of statin and diet Rx.  No myalgias or side effects.  F/U  LFT's in 6 months. No results found for this basename: LDLCALC             

## 2012-02-22 NOTE — Patient Instructions (Signed)
Your physician wants you to follow-up in:   6 MONTHS WITH DR  Haywood Filler will receive a reminder letter in the mail two months in advance. If you don't receive a letter, please call our office to schedule the follow-up appointment. Your physician recommends that you continue on your current medications as directed. Please refer to the Current Medication list given to you today. Your physician has requested that you have en exercise stress myoview. For further information please visit [email or web address] Please follow instruction sheet, as given. DX CHEST PAIN

## 2012-02-22 NOTE — Assessment & Plan Note (Signed)
Discussed low carb diet.  Target hemoglobin A1c is 6.5 or less.  Continue current medications.  

## 2012-02-22 NOTE — Assessment & Plan Note (Signed)
Well controlled.  Continue current medications and low sodium Dash type diet.    

## 2012-02-22 NOTE — Assessment & Plan Note (Signed)
Known CAD new chest pain ECG ok F/U stress myovue

## 2012-02-29 ENCOUNTER — Ambulatory Visit (HOSPITAL_COMMUNITY): Payer: PRIVATE HEALTH INSURANCE | Attending: Internal Medicine | Admitting: Radiology

## 2012-02-29 VITALS — BP 138/88 | Ht 73.0 in | Wt 298.0 lb

## 2012-02-29 DIAGNOSIS — R079 Chest pain, unspecified: Secondary | ICD-10-CM

## 2012-02-29 DIAGNOSIS — E119 Type 2 diabetes mellitus without complications: Secondary | ICD-10-CM | POA: Insufficient documentation

## 2012-02-29 DIAGNOSIS — I10 Essential (primary) hypertension: Secondary | ICD-10-CM | POA: Insufficient documentation

## 2012-02-29 DIAGNOSIS — I25810 Atherosclerosis of coronary artery bypass graft(s) without angina pectoris: Secondary | ICD-10-CM

## 2012-02-29 MED ORDER — TECHNETIUM TC 99M SESTAMIBI GENERIC - CARDIOLITE
32.2000 | Freq: Once | INTRAVENOUS | Status: AC | PRN
  Administered 2012-02-29: 32.2 via INTRAVENOUS

## 2012-02-29 NOTE — Progress Notes (Signed)
[facility] SITE 3 NUCLEAR MED [address] [place], [place] [address] [phone]    Cardiology Nuclear Med Study  Justin Gonzales is a 57 y.o. male     MRN : [id number]     DOB: [birth year]  Procedure Date: 02/29/2012  Nuclear Med Background Indication for Stress Test:  Evaluation for Ischemia and Stent Patency History:  '07 MI: IWMI, Heart Cath: EF: 60% Stent:RCA, CABG Cardiac Risk Factors: Hypertension, IDDM Type 2 and Lipids  Symptoms:  Chest Pain   Nuclear Pre-Procedure Caffeine/Decaff Intake:  None> 12 hrs NPO After: 6:00pm   Lungs:  clear O2 Sat: 96% on room air. IV 0.9% NS with Angio Cath:  20g  IV Site: R Antecubital x 1, tolerated well IV Started by:  Irean Hong, RN  Chest Size (in):  56 Cup Size: n/a  Height: 6\' 1"  (1.854 m)  Weight:  298 lb (135.172 kg)  BMI:  Body mass index is 39.32 kg/(m^2). Tech Comments:  Took Toprol at 3:00am; FBS was 140 @ 6:20am per patient, no insulin or diabetic medication today    Nuclear Med Study 1 or 2 day study: 2 day  Stress Test Type:  Stress  Reading MD: Cassell Clement, MD  Order Authorizing Provider:  Charlton Haws, MD  Resting Radionuclide: Technetium 14m Sestamibi  Resting Radionuclide Dose: 32.7 mCi on 03/01/12   Stress Radionuclide:  Technetium 62m Sestamibi  Stress Radionuclide Dose: 32.2 mCi on 02/29/12           Stress Protocol Rest HR: 80 Stress HR: 144  Rest BP: 138/88 Stress BP: 185  Exercise Time (min): 9:31 METS: 10.90   Predicted Max HR: 165 bpm % Max HR: 87.27 bpm Rate Pressure Product: 10272    Dose of Adenosine (mg):  n/a Dose of Lexiscan: n/a mg  Dose of Atropine (mg): n/a Dose of Dobutamine: n/a mcg/kg/min (at max HR)  Stress Test Technologist: Milana Na, EMT-P  Nuclear Technologist:  Doyne Keel, CNMT     Rest Procedure:  Myocardial perfusion imaging was performed at rest 45 minutes following the intravenous administration of Technetium 29m Sestamibi. Rest ECG: NSR -  Normal EKG  Stress Procedure:  The patient exercised on the treadmill utilizing the Bruce Protocol for 9:31 minutes. The patient stopped due to fatigue and denied any chest pain and had rare pacs/pvcs.  Technetium 67m Sestamibi was injected at peak exercise and myocardial perfusion imaging was performed after a brief delay. Stress ECG: No significant change from baseline ECG  QPS Raw Data Images:  Normal; no motion artifact; normal heart/lung ratio. Stress Images:  There is decreased uptake in the inferior wall. Rest Images:  There is decreased uptake in the inferior wall. Subtraction (SDS):  There is a fixed inferior defect that is most consistent with diaphragmatic attenuation. Transient Ischemic Dilatation (Normal <1.22):  0.83 Lung/Heart Ratio (Normal <0.45):  0.47  Quantitative Gated Spect Images QGS EDV:  195 ml QGS ESV:  95 ml  Impression Exercise Capacity:  Good exercise capacity. BP Response:  Normal blood pressure response. Clinical Symptoms:  No significant symptoms noted. ECG Impression:  No significant ST segment change suggestive of ischemia. Comparison with Prior Nuclear Study: No images to compare  Overall Impression:  Low risk stress nuclear study. There is a small scar of moderate severity involving the basal inferior and mid-inferior segments.  There is no reversible ischemia.   LV Ejection Fraction: 51%.  LV Wall Motion:  Very minimal inferobasal hypokinesis  Cassell Clement

## 2012-03-01 ENCOUNTER — Ambulatory Visit (HOSPITAL_COMMUNITY): Payer: PRIVATE HEALTH INSURANCE | Attending: Cardiology | Admitting: Radiology

## 2012-03-01 DIAGNOSIS — R0989 Other specified symptoms and signs involving the circulatory and respiratory systems: Secondary | ICD-10-CM

## 2012-03-01 MED ORDER — TECHNETIUM TC 99M SESTAMIBI GENERIC - CARDIOLITE
32.7000 | Freq: Once | INTRAVENOUS | Status: AC | PRN
  Administered 2012-03-01: 32.7 via INTRAVENOUS

## 2012-03-03 ENCOUNTER — Telehealth: Payer: Self-pay | Admitting: Cardiovascular Disease

## 2012-03-03 NOTE — Telephone Encounter (Signed)
PT'S DAUGHTER AWARE OF MYOVIEW RESULTS ./CY 

## 2012-03-03 NOTE — Telephone Encounter (Signed)
New Problem:    Patient's daughter called returning your call about his Stress test results.  Please call back.

## 2012-08-31 ENCOUNTER — Ambulatory Visit (INDEPENDENT_AMBULATORY_CARE_PROVIDER_SITE_OTHER): Payer: PRIVATE HEALTH INSURANCE | Admitting: Cardiovascular Disease

## 2012-08-31 ENCOUNTER — Encounter: Payer: Self-pay | Admitting: Cardiovascular Disease

## 2012-08-31 VITALS — BP 142/68 | HR 72 | Ht 74.0 in | Wt 307.0 lb

## 2012-08-31 DIAGNOSIS — R609 Edema, unspecified: Secondary | ICD-10-CM

## 2012-08-31 DIAGNOSIS — I10 Essential (primary) hypertension: Secondary | ICD-10-CM

## 2012-08-31 DIAGNOSIS — E7800 Pure hypercholesterolemia, unspecified: Secondary | ICD-10-CM

## 2012-08-31 DIAGNOSIS — I2510 Atherosclerotic heart disease of native coronary artery without angina pectoris: Secondary | ICD-10-CM

## 2012-08-31 MED ORDER — HYDROCHLOROTHIAZIDE 12.5 MG PO CAPS: 12.5000 mg | ORAL_CAPSULE | ORAL | Status: DC | PRN

## 2012-08-31 NOTE — Assessment & Plan Note (Signed)
Cholesterol is at goal.  Continue current dose of statin and diet Rx.  No myalgias or side effects.  F/U  LFT's in 6 months. No results found for this basename: LDLCALC             

## 2012-08-31 NOTE — Assessment & Plan Note (Signed)
Stable with no angina and good activity level.  Continue medical Rx Low risk myouve 1/14

## 2012-08-31 NOTE — Assessment & Plan Note (Signed)
Well controlled.  Continue current medications and low sodium Dash type diet.    

## 2012-08-31 NOTE — Patient Instructions (Addendum)
Your physician wants you to follow-up in:   YEAR WITH DR Haywood Filler will receive a reminder letter in the mail two months in advance. If you don't receive a letter, please call our office to schedule the follow-up appointment. Your physician has recommended you make the following change in your medication: TAKE  HCTZ 12.5 MG AS NEEDED FOR  SWELLING

## 2012-08-31 NOTE — Assessment & Plan Note (Signed)
Dependant but can make LE eczema worse  PRN HCTZ called into [facility]

## 2012-08-31 NOTE — Progress Notes (Signed)
Patient ID: Justin Gonzales, male   DOB: [birth year], 56 y.o.   MRN: [id number] Justin Gonzales is seen for F/U of CAD. He has a previous stent to the RCA and subsequent CABG in 2007  He has not had any recurrent SSCP or SOB. He has normal LV funciton. He was asking for nitrospray instead of the pills. 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 He is very well educated and has a business degree from [place]. His psoriasis continues to be horrific and stopping his Plavix has not helped. He has not had any , palpitations, SOB, edema or syncope. He has been compliant with his meds His BS has been suboptimally controlled due to dietary indiscretion. DM better than last year with insulin.  Myovue done 1/14 low risk with old inferior scar and EF 51% no ishcemia  Psoriasis is much better with green tea extract  ROS: Denies fever, malais, weight loss, blurry vision, decreased visual acuity, cough, sputum, SOB, hemoptysis, pleuritic pain, palpitaitons, heartburn, abdominal pain, melena, lower extremity edema, claudication, or rash.  All other systems reviewed and negative  General: Affect appropriate Obese slavic male HEENT: normal Neck supple with no adenopathy JVP normal no bruits no thyromegaly Lungs clear with no wheezing and good diaphragmatic motion Heart:  S1/S2 no murmur, no rub, gallop or click PMI normal Abdomen: benighn, BS positve, no tenderness, no AAA no bruit.  No HSM or HJR Distal pulses intact with no bruits Plus one to two bilateral edema Neuro non-focal Skin warm and dry psoriatic patch on back and LLE No muscular weakness   Current Outpatient Prescriptions  Medication Sig Dispense Refill  . atorvastatin (LIPITOR) 80 MG tablet Take 1 tablet (80 mg total) by mouth at bedtime.  30 tablet  11  . fosinopril (MONOPRIL) 40 MG tablet Take 40 mg by mouth daily.        . insulin aspart (NOVOLOG FLEXPEN) 100 UNIT/ML injection Inject 40 Units into the skin daily.        . metoprolol succinate  (TOPROL-XL) 50 MG 24 hr tablet Take 1 tablet (50 mg total) by mouth daily.  30 tablet  11  . nitroGLYCERIN (NITROLINGUAL) 0.4 MG/SPRAY spray Place 1 spray under the tongue every 5 (five) minutes as needed.  12 g  2  . sertraline (ZOLOFT) 100 MG tablet Take 100 mg by mouth daily.        . sitaGLIPtan-metformin (JANUMET) 50-1000 MG per tablet Take 1 tablet by mouth 2 (two) times daily with a meal.         No current facility-administered medications for this visit.    Allergies  Penicillins  Electrocardiogram:  02/22/12  SR rate 70 normal   Assessment and Plan

## 2013-01-13 ENCOUNTER — Other Ambulatory Visit: Payer: Self-pay | Admitting: Internal Medicine

## 2013-01-15 ENCOUNTER — Other Ambulatory Visit: Payer: Self-pay

## 2013-01-15 MED ORDER — NITROGLYCERIN 0.4 MG/SPRAY TL SOLN: 1.0000 | Status: DC | PRN

## 2013-02-07 ENCOUNTER — Other Ambulatory Visit: Payer: Self-pay | Admitting: Cardiovascular Disease

## 2013-03-06 ENCOUNTER — Other Ambulatory Visit: Payer: Self-pay | Admitting: Cardiovascular Disease

## 2013-05-28 ENCOUNTER — Other Ambulatory Visit: Payer: Self-pay | Admitting: Cardiovascular Disease

## 2013-06-19 ENCOUNTER — Other Ambulatory Visit: Payer: Self-pay | Admitting: Cardiovascular Disease

## 2013-08-22 ENCOUNTER — Other Ambulatory Visit: Payer: Self-pay | Admitting: Cardiovascular Disease

## 2013-09-04 ENCOUNTER — Encounter: Payer: Self-pay | Admitting: Cardiovascular Disease

## 2013-09-04 ENCOUNTER — Ambulatory Visit (INDEPENDENT_AMBULATORY_CARE_PROVIDER_SITE_OTHER): Payer: PRIVATE HEALTH INSURANCE | Admitting: Cardiovascular Disease

## 2013-09-04 VITALS — BP 132/80 | HR 79 | Ht 74.0 in | Wt 310.8 lb

## 2013-09-04 DIAGNOSIS — I10 Essential (primary) hypertension: Secondary | ICD-10-CM

## 2013-09-04 DIAGNOSIS — E119 Type 2 diabetes mellitus without complications: Secondary | ICD-10-CM

## 2013-09-04 DIAGNOSIS — I2510 Atherosclerotic heart disease of native coronary artery without angina pectoris: Secondary | ICD-10-CM

## 2013-09-04 DIAGNOSIS — E7800 Pure hypercholesterolemia, unspecified: Secondary | ICD-10-CM

## 2013-09-04 NOTE — Assessment & Plan Note (Signed)
Cholesterol is at goal.  Continue current dose of statin and diet Rx.  No myalgias or side effects.  F/U  LFT's in 6 months. No results found for this basename: LDLCALC  Labs with primary            

## 2013-09-04 NOTE — Patient Instructions (Signed)
Your physician wants you to follow-up in: YEAR WITH DR NISHAN  You will receive a reminder letter in the mail two months in advance. If you don't receive a letter, please call our office to schedule the follow-up appointment.  Your physician recommends that you continue on your current medications as directed. Please refer to the Current Medication list given to you today. 

## 2013-09-04 NOTE — Assessment & Plan Note (Signed)
Stable with no angina and good activity level.  Continue medical Rx  

## 2013-09-04 NOTE — Assessment & Plan Note (Signed)
Discussed low carb diet.  Target hemoglobin A1c is 6.5 or less.  Continue current medications.  

## 2013-09-04 NOTE — Progress Notes (Signed)
Patient ID: Justin Gonzales, male   DOB: 1955/10/24, 58 y.o.   MRN: [id number] Justin Gonzales is seen for F/U of CAD. He has a previous stent to the RCA and subsequent CABG in 2007  He has not had any recurrent SSCP or SOB. He has normal LV funciton. He was asking for nitrospray instead of the pills. 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 His psoriasis continues to be horrific and stopping his Plavix has not helped. He has not had any , palpitations, SOB, edema or syncope. He has been compliant with his meds His BS has been suboptimally controlled due to dietary indiscretion. DM better than last year with insulin.  Myovue done 1/14 low risk with old inferior scar and EF 51% no ishcemia Psoriasis is much better with green tea extract    ROS: Denies fever, malais, weight loss, blurry vision, decreased visual acuity, cough, sputum, SOB, hemoptysis, pleuritic pain, palpitaitons, heartburn, abdominal pain, melena, lower extremity edema, claudication, or rash.  All other systems reviewed and negative  General: Affect appropriate [name]  HEENT: normal Neck supple with no adenopathy JVP normal no bruits no thyromegaly Lungs clear with no wheezing and good diaphragmatic motion Heart:  S1/S2 no murmur, no rub, gallop or click PMI normal Abdomen: benighn, BS positve, no tenderness, no AAA no bruit.  No HSM or HJR Distal pulses intact with no bruits No edema Neuro non-focal Skin warm and dry No muscular weakness   Current Outpatient Prescriptions  Medication Sig Dispense Refill  . atorvastatin (LIPITOR) 80 MG tablet TAKE ONE TABLET BY MOUTH AT BEDTIME  30 tablet  0  . fosinopril (MONOPRIL) 40 MG tablet Take 40 mg by mouth daily.        . hydrochlorothiazide (MICROZIDE) 12.5 MG capsule Take 1 capsule (12.5 mg total) by mouth as needed.  30 capsule  2  . insulin aspart (NOVOLOG FLEXPEN) 100 UNIT/ML injection Inject 40 Units  into the skin daily.        . metoprolol succinate (TOPROL-XL) 50 MG 24 hr tablet TAKE ONE TABLET BY MOUTH ONCE DAILY  30 tablet  0  . nitroGLYCERIN (NITROLINGUAL) 0.4 MG/SPRAY spray Place 1 spray under the tongue every 5 (five) minutes as needed.  12 g  2  . sertraline (ZOLOFT) 100 MG tablet Take 100 mg by mouth daily.        . sitaGLIPtan-metformin (JANUMET) 50-1000 MG per tablet Take 1 tablet by mouth 2 (two) times daily with a meal.         No current facility-administered medications for this visit.    Allergies  Penicillins  Electrocardiogram:  Sr rate 79 normal   Assessment and Plan

## 2013-09-04 NOTE — Assessment & Plan Note (Signed)
Well controlled.  Continue current medications and low sodium Dash type diet.    

## 2013-09-24 ENCOUNTER — Other Ambulatory Visit: Payer: Self-pay | Admitting: Cardiovascular Disease

## 2014-01-09 ENCOUNTER — Other Ambulatory Visit: Payer: Self-pay | Admitting: Otolaryngology

## 2014-01-09 DIAGNOSIS — H6520 Chronic serous otitis media, unspecified ear: Secondary | ICD-10-CM

## 2014-01-09 DIAGNOSIS — H60391 Other infective otitis externa, right ear: Secondary | ICD-10-CM

## 2014-01-09 DIAGNOSIS — H6691 Otitis media, unspecified, right ear: Secondary | ICD-10-CM

## 2014-01-09 DIAGNOSIS — H9213 Otorrhea, bilateral: Secondary | ICD-10-CM

## 2014-01-09 DIAGNOSIS — H6690 Otitis media, unspecified, unspecified ear: Secondary | ICD-10-CM

## 2014-01-10 ENCOUNTER — Ambulatory Visit
Admission: RE | Admit: 2014-01-10 | Discharge: 2014-01-10 | Disposition: A | Discharge status: Home / Self Care | Payer: PRIVATE HEALTH INSURANCE | Source: Ambulatory Visit | Attending: Otolaryngology | Admitting: Otolaryngology

## 2014-01-10 DIAGNOSIS — H6520 Chronic serous otitis media, unspecified ear: Secondary | ICD-10-CM

## 2014-01-10 DIAGNOSIS — H60391 Other infective otitis externa, right ear: Secondary | ICD-10-CM

## 2014-01-10 DIAGNOSIS — H9213 Otorrhea, bilateral: Secondary | ICD-10-CM

## 2014-01-10 DIAGNOSIS — H6690 Otitis media, unspecified, unspecified ear: Secondary | ICD-10-CM

## 2014-01-10 DIAGNOSIS — H6691 Otitis media, unspecified, right ear: Secondary | ICD-10-CM

## 2014-03-06 ENCOUNTER — Ambulatory Visit: Admit: 2014-03-06 | Payer: Self-pay | Admitting: Otolaryngology

## 2014-03-06 SURGERY — TYMPANOPLASTY, WITH MASTOIDECTOMY
Anesthesia: General | Site: Ear | Laterality: Right

## 2014-03-21 ENCOUNTER — Other Ambulatory Visit: Payer: Self-pay | Admitting: Cardiovascular Disease

## 2014-03-23 ENCOUNTER — Other Ambulatory Visit: Payer: Self-pay | Admitting: Cardiovascular Disease

## 2014-04-15 ENCOUNTER — Other Ambulatory Visit: Payer: Self-pay | Admitting: Internal Medicine

## 2014-04-17 ENCOUNTER — Other Ambulatory Visit: Payer: Self-pay

## 2014-04-17 MED ORDER — NITROGLYCERIN 0.4 MG/SPRAY TL SOLN: Status: DC

## 2014-09-18 ENCOUNTER — Other Ambulatory Visit: Payer: Self-pay | Admitting: Cardiovascular Disease

## 2014-10-21 ENCOUNTER — Other Ambulatory Visit: Payer: Self-pay | Admitting: Cardiovascular Disease

## 2014-11-22 ENCOUNTER — Other Ambulatory Visit: Payer: Self-pay | Admitting: Cardiovascular Disease

## 2014-12-19 ENCOUNTER — Other Ambulatory Visit: Payer: Self-pay | Admitting: Cardiovascular Disease

## 2015-01-18 ENCOUNTER — Other Ambulatory Visit: Payer: Self-pay | Admitting: Cardiovascular Disease

## 2015-02-28 NOTE — Progress Notes (Signed)
No show

## 2015-03-06 ENCOUNTER — Encounter: Payer: PRIVATE HEALTH INSURANCE | Admitting: Cardiovascular Disease

## 2015-03-07 ENCOUNTER — Encounter: Payer: Self-pay | Admitting: Cardiovascular Disease

## 2015-03-12 ENCOUNTER — Encounter: Payer: Self-pay | Admitting: Cardiovascular Disease

## 2015-03-12 NOTE — Progress Notes (Signed)
Patient ID: Justin Gonzales, male   DOB: [birth year], 60 y.o.   MRN: [id number]   [name] is seen for F/U of CAD. He has a previous stent to the RCA and subsequent CABG in 2007  He has not had any recurrent SSCP or SOB. He has normal LV funciton. He was asking for nitrospray instead of the pills. 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 His psoriasis continues to be horrific and stopping his Plavix has not helped. He has not had any , palpitations, SOB, edema or syncope. He has been compliant with his meds His BS has been suboptimally controlled due to dietary indiscretion. DM better than last year with insulin.   Myovue done 1/14 low risk with old inferior scar and EF 51% no ishcemia Psoriasis is much better with green tea extract  Having URI and sore throat    ROS: Denies fever, malais, weight loss, blurry vision, decreased visual acuity, cough, sputum, SOB, hemoptysis, pleuritic pain, palpitaitons, heartburn, abdominal pain, melena, lower extremity edema, claudication, or rash.  All other systems reviewed and negative  General: Affect appropriate Obese [place] male  HEENT: normal Neck supple with no adenopathy JVP normal no bruits no thyromegaly Lungs clear with no wheezing and good diaphragmatic motion Heart:  S1/S2 no murmur, no rub, gallop or click PMI normal Abdomen: benighn, BS positve, no tenderness, no AAA no bruit.  No HSM or HJR Distal pulses intact with no bruits No edema Neuro non-focal Skin warm and dry No muscular weakness   Current Outpatient Prescriptions  Medication Sig Dispense Refill  . atorvastatin (LIPITOR) 80 MG tablet Take 80 mg by mouth daily.    . fosinopril (MONOPRIL) 40 MG tablet Take 40 mg by mouth daily.      . hydrochlorothiazide (MICROZIDE) 12.5 MG capsule Take 12.5 mg by mouth daily.    . insulin aspart (NOVOLOG FLEXPEN) 100 UNIT/ML injection Inject 60 Units into the skin 2 (two)  times daily.     . metoprolol succinate (TOPROL-XL) 50 MG 24 hr tablet Take 50 mg by mouth daily. Take with or immediately following a meal.    . nitroGLYCERIN (NITROLINGUAL) 0.4 MG/SPRAY spray DISSOLVE ONE TABLET UNDER THE TONGUE EVERY 5 MINUTES AS NEEDED FOR CHEST PAIN.  DO NOT EXCEED A TOTAL OF 3 DOSES IN 15 MINUTES 0.4 g 5  . sertraline (ZOLOFT) 100 MG tablet Take 100 mg by mouth daily.      . sitaGLIPtan-metformin (JANUMET) 50-1000 MG per tablet Take 1 tablet by mouth 2 (two) times daily with a meal.       No current facility-administered medications for this visit.    Allergies  Penicillins  Electrocardiogram:   09/04/13   SR rate 79 normal   Assessment and Plan CAD/CABG:  2007  No angina compliant with meds non ischemic myovue 2014 Psoriasis:  Improved with green tea extract f/u dermatology HTN:  Well controlled.  Continue current medications and low sodium Dash type diet.   DM: Discussed low carb diet.  Target hemoglobin A1c is 6.5 or less.  Continue current medications. Chol:  On statin labs with primary  URI:  5 day dose pack Z pack written for f/u primary    [name]

## 2015-03-13 ENCOUNTER — Encounter: Payer: Self-pay | Admitting: Cardiovascular Disease

## 2015-03-13 ENCOUNTER — Ambulatory Visit (INDEPENDENT_AMBULATORY_CARE_PROVIDER_SITE_OTHER): Payer: PRIVATE HEALTH INSURANCE | Admitting: Cardiovascular Disease

## 2015-03-13 VITALS — BP 158/80 | HR 77 | Resp 18 | Ht 73.0 in | Wt 318.0 lb

## 2015-03-13 DIAGNOSIS — E7800 Pure hypercholesterolemia, unspecified: Secondary | ICD-10-CM

## 2015-03-13 MED ORDER — ATORVASTATIN CALCIUM 80 MG PO TABS: 80.0000 mg | ORAL_TABLET | Freq: Every day | ORAL | Status: DC

## 2015-03-13 MED ORDER — AZITHROMYCIN 250 MG PO TABS: ORAL_TABLET | ORAL | Status: DC

## 2015-03-13 NOTE — Patient Instructions (Addendum)
Medication Instructions:  Your physician has recommended you make the following change in your medication:  1-Take azithromycin as directed. Take 2 tablets by mouth first day and 1 tablet by mouth for 4 days.  Labwork: NONE  Testing/Procedures: NONE  Follow-Up: Your physician wants you to follow-up in: 6 months with Dr. Johnsie Cancel. You will receive a reminder letter in the mail two months in advance. If you don't receive a letter, please call our office to schedule the follow-up appointment.  If you need a refill on your cardiac medications before your next appointment, please call your pharmacy.

## 2015-03-24 ENCOUNTER — Other Ambulatory Visit: Payer: Self-pay | Admitting: Cardiovascular Disease

## 2015-03-25 MED ORDER — METOPROLOL SUCCINATE ER 50 MG PO TB24: 50.0000 mg | ORAL_TABLET | Freq: Every day | ORAL | Status: DC

## 2015-10-24 IMAGING — CT CT TEMPORAL BONES W/O CM
4 of 6 series · 16 of 30 positions shown, 18 images · non-contrast
Comparison: None.

CLINICAL DATA: Chronic right otitis media. Bilateral otorrhea.
Right ear hearing loss.

EXAM:
CT TEMPORAL BONES WITHOUT CONTRAST
TECHNIQUE: Axial and coronal plane CT imaging of the petrous temporal bones was
performed with thin-collimation image reconstruction. No intravenous
contrast was administered. Multiplanar CT image reconstructions were
also generated.

[Series 3: ax mag right · axial · 0.20mm/px · z∈[-52,+3]mm · 5 of 265 slices shown, 7 images]
[im 45/265  brain]
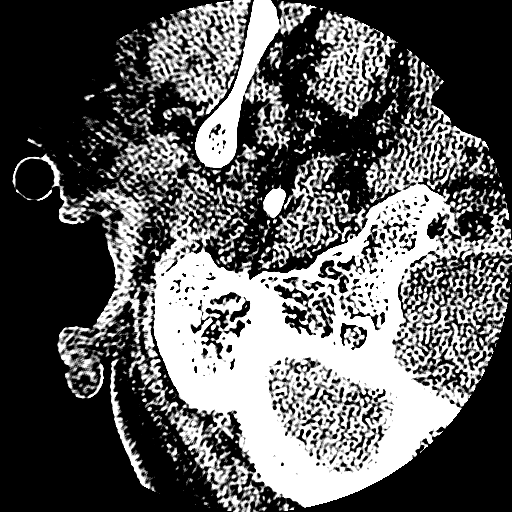
[im 45/265  bone]
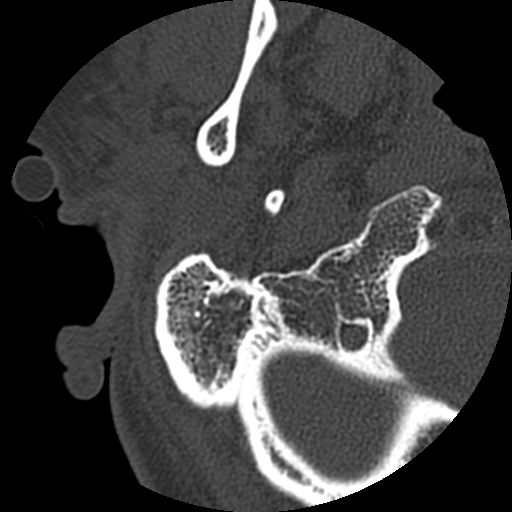
[im 89/265  bone]
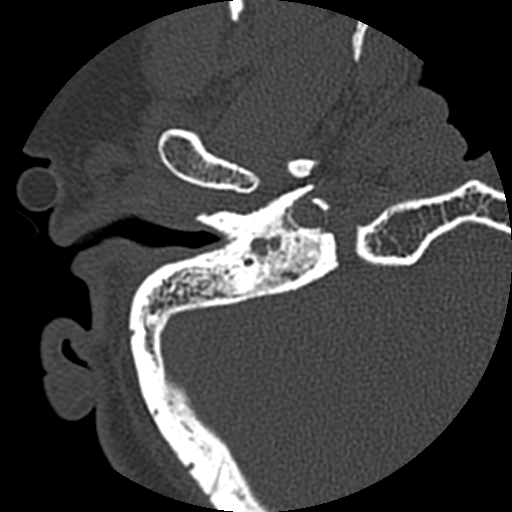
[im 133/265  bone]
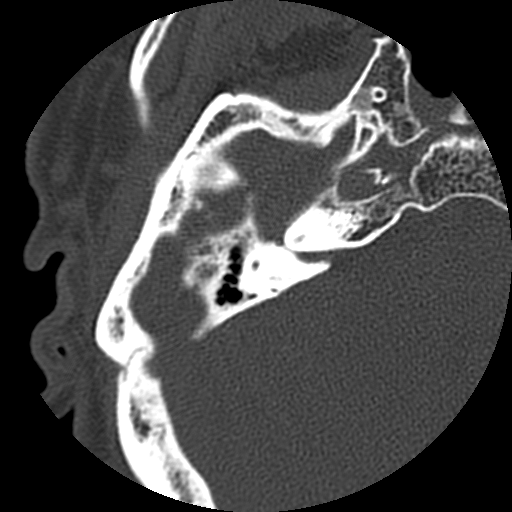
[im 177/265  bone]
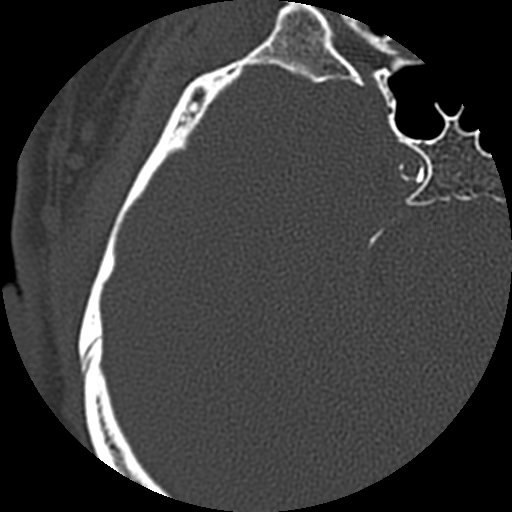
[im 221/265  brain]
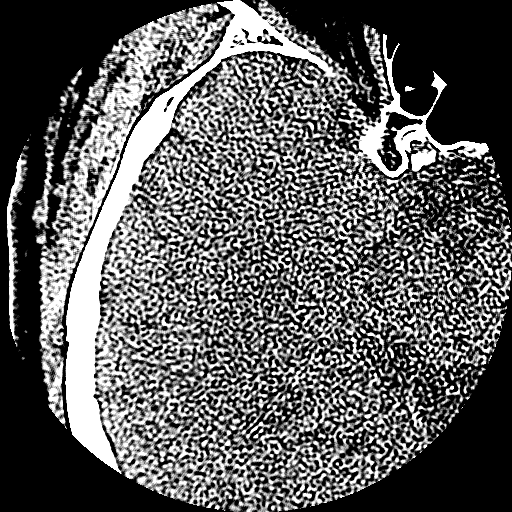
[im 221/265  bone]
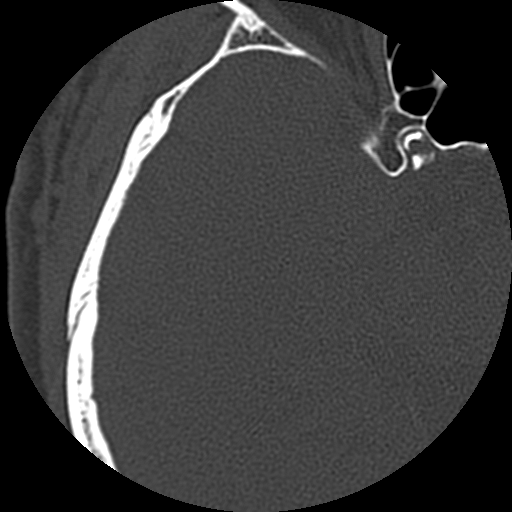

[Series 4: ax mag left · axial · 0.20mm/px · z∈[-49,+1]mm · 4 of 265 slices shown]
[im 53/265  bone]
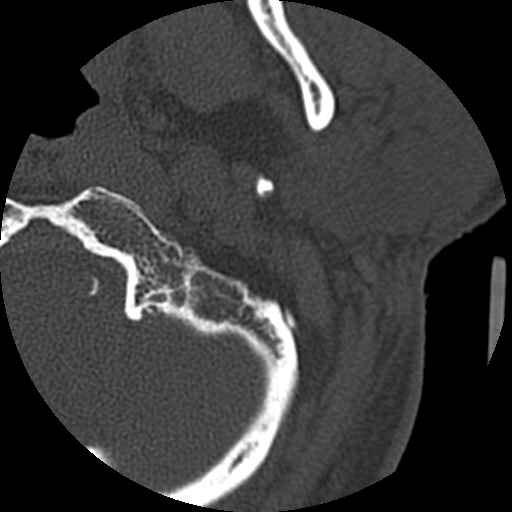
[im 106/265  bone]
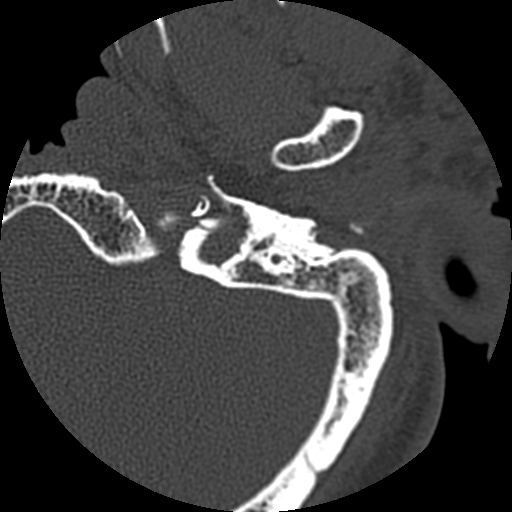
[im 159/265  bone]
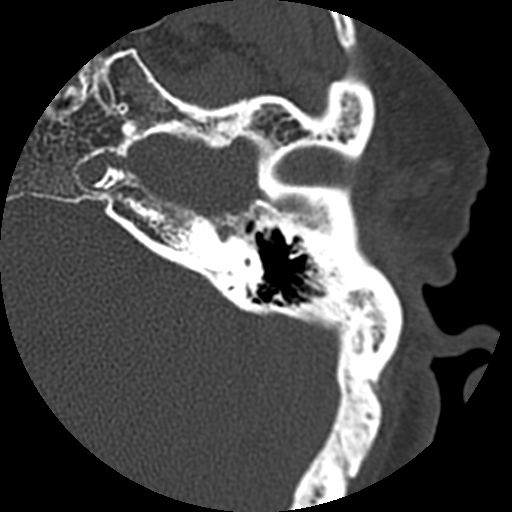
[im 212/265  bone]
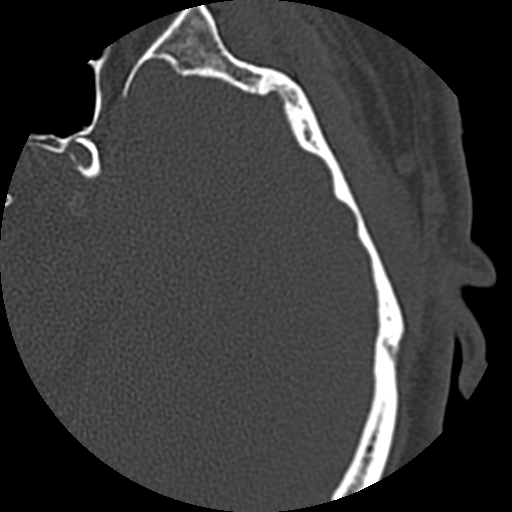

[Series 300: cor right mag · coronal · 0.20mm/px · 2 of 278 slices shown]
[im 56/278  bone]
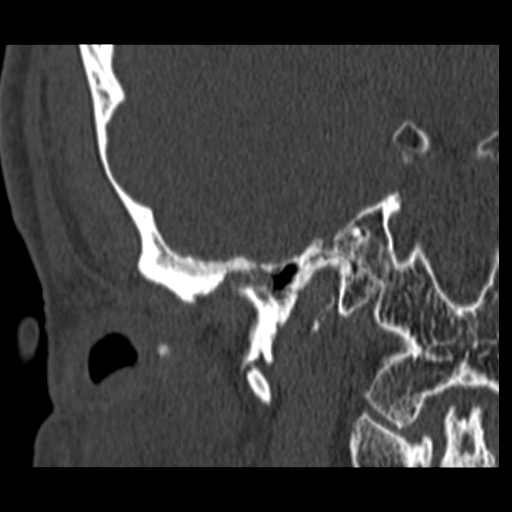
[im 111/278  bone]
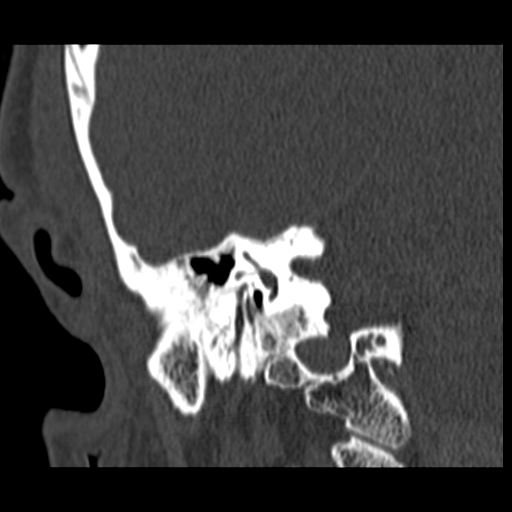

[Series 400: cor left mag · coronal · 0.20mm/px · 5 of 313 slices shown]
[im 53/313  bone]
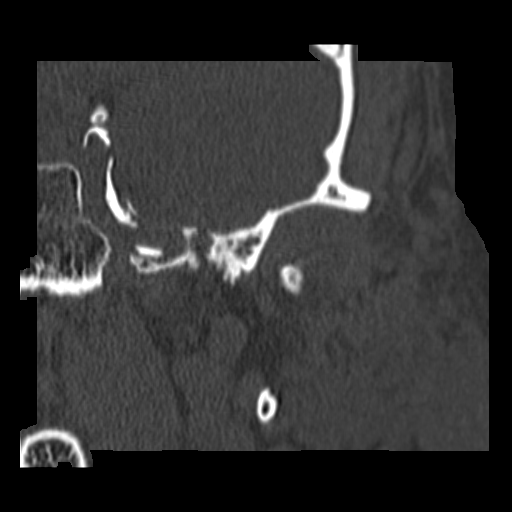
[im 105/313  bone]
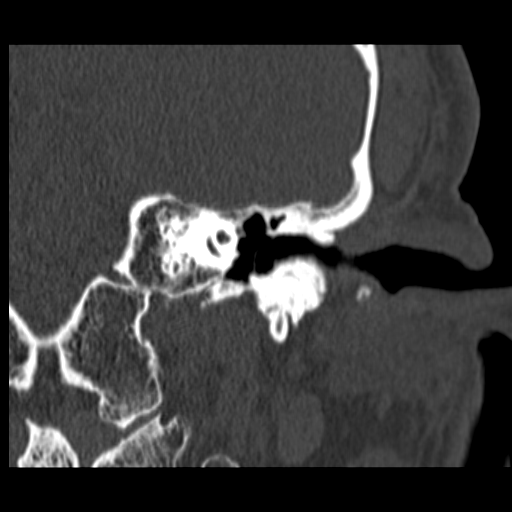
[im 157/313  bone]
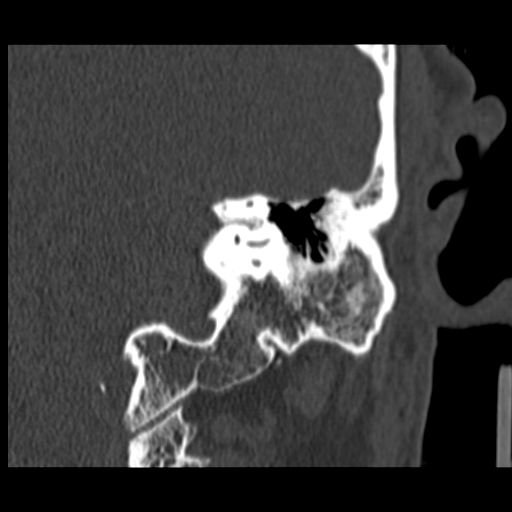
[im 209/313  bone]
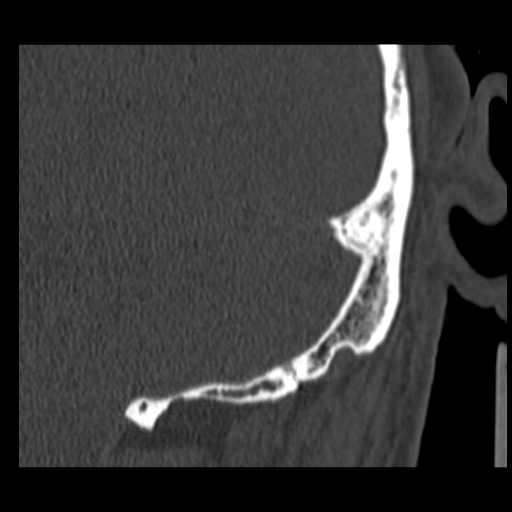
[im 261/313  bone]
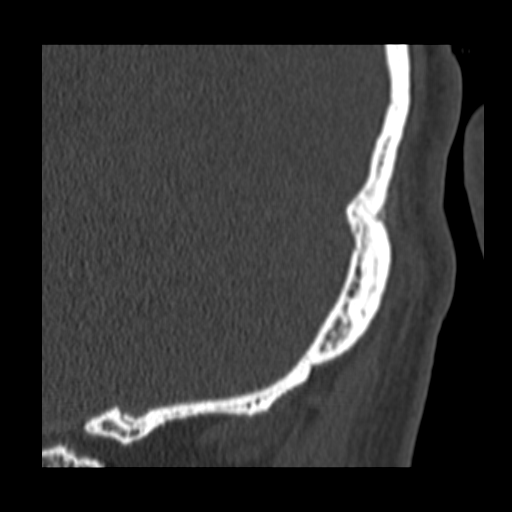

[16 of 30 positions shown; findings below may reference images not displayed]

FINDINGS: RIGHT: There is prominent soft tissue thickening in the medial
aspect of the external auditory canal extending to the tympanic
membrane and into the middle ear. Soft tissue involves Prussak's
space, and there is erosion of the scutum. Soft tissue is present in
the epitympanum and mesotympanum along the anterior aspect of the
malleus and there is partial erosion of the head of the malleus as
well as the manubrium. The incus appears intact. The stapes is also
grossly intact. Soft tissue extends into the hypotympanum and also
erodes through the anterior wall of the EAC into the posteromedial
aspect of the temporomandibular joint. The internal auditory canal,
cochlea, vestibule, and semicircular canals are unremarkable. There
is minimal mastoid pneumatization.

LEFT: The external auditory canal is unremarkable. Tympanic membrane
appears mildly thickened. The ossicles appear intact. The tympanic
cavity is clear. The internal auditory canal, cochlea, vestibule,
and semicircular canals are unremarkable. There is only a small
amount of mastoid pneumatization.

The visualized paranasal sinuses are clear. The visualized portion
of the brain is unremarkable. Visualized orbits are unremarkable.
There is extensive carotid siphon atherosclerotic calcification
bilaterally. Mild-to-moderate intracranial left vertebral artery
calcification is also noted.
IMPRESSION: 1. Abnormal soft tissue in the right middle ear and external
auditory canal with associated osseous erosion as above, including
of the malleus, consistent with cholesteatoma.
2. Mild thickening of the left tympanic membrane. Otherwise
unremarkable appearance of the left temporal bone.
3. Extensive, age advanced atherosclerosis.

## 2015-12-02 ENCOUNTER — Other Ambulatory Visit: Payer: Self-pay | Admitting: Cardiovascular Disease

## 2016-03-03 ENCOUNTER — Other Ambulatory Visit: Payer: Self-pay | Admitting: Cardiovascular Disease

## 2016-03-08 NOTE — Progress Notes (Signed)
Patient ID: Justin Gonzales, male   DOB: 11-Jun-1955, 61 y.o.   MRN: [id number]   Justin Gonzales is seen for F/U of CAD. He has a previous stent to the RCA and subsequent CABG in 2007  He has not had any recurrent SSCP or SOB. He has normal LV funciton. He was asking for nitrospray instead of the pills. 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 His psoriasis continues to be horrific and stopping his Plavix has not helped. He has not had any , palpitations, SOB, edema or syncope. He has been compliant with his meds His BS has been suboptimally controlled due to dietary indiscretion. DM better than last year with insulin.   Myovue done 1/14 low risk with old inferior scar and EF 51% no ishcemia  Psoriasis is much better with green tea extract  Has son in [place] and daughter in [place] back to [place] in July   ROS: Denies fever, malais, weight loss, blurry vision, decreased visual acuity, cough, sputum, SOB, hemoptysis, pleuritic pain, palpitaitons, heartburn, abdominal pain, melena, lower extremity edema, claudication, or rash.  All other systems reviewed and negative  General: Affect appropriate Obese [place] male  HEENT: normal Neck supple with no adenopathy JVP normal no bruits no thyromegaly Lungs clear with no wheezing and good diaphragmatic motion Heart:  S1/S2 no murmur, no rub, gallop or click PMI normal Abdomen: benighn, BS positve, no tenderness, no AAA no bruit.  No HSM or HJR Distal pulses intact with no bruits No edema Neuro non-focal Skin warm and dry No muscular weakness     Allergies  Penicillins  Electrocardiogram:   09/04/13   SR rate 79 normal  03/12/16 SR rate 73 ? Old IMI poor R wave progression   Assessment and Plan CAD/CABG:  2007  No angina compliant with meds non ischemic myovue 2014  Psoriasis:  Improved with green tea extract f/u dermatology  HTN:  Well controlled.  Continue  current medications and low sodium Dash type diet.   DM: Discussed low carb diet.  Target hemoglobin A1c is 6.5 or less.  Continue current medications.  Chol:  On statin labs with primary     [name]

## 2016-03-12 ENCOUNTER — Encounter: Payer: Self-pay | Admitting: Cardiovascular Disease

## 2016-03-12 ENCOUNTER — Ambulatory Visit (INDEPENDENT_AMBULATORY_CARE_PROVIDER_SITE_OTHER): Payer: PRIVATE HEALTH INSURANCE | Admitting: Cardiovascular Disease

## 2016-03-12 VITALS — BP 160/90 | HR 93 | Ht 72.0 in | Wt 317.1 lb

## 2016-03-12 DIAGNOSIS — E7800 Pure hypercholesterolemia, unspecified: Secondary | ICD-10-CM | POA: Diagnosis not present

## 2016-03-12 NOTE — Patient Instructions (Signed)
Medication Instructions:  Your physician recommends that you continue on your current medications as directed. Please refer to the Current Medication list given to you today.   Labwork: NONE  Testing/Procedures: NONE  Follow-Up: Your physician wants you to follow-up in: 6 months with Dr. Johnsie Cancel   Any Other Special Instructions Will Be Listed Below (If Applicable). NONE    If you need a refill on your cardiac medications before your next appointment, please call your pharmacy.

## 2016-03-30 ENCOUNTER — Other Ambulatory Visit: Payer: Self-pay | Admitting: Cardiovascular Disease

## 2016-07-26 ENCOUNTER — Other Ambulatory Visit: Payer: Self-pay | Admitting: Cardiovascular Disease

## 2016-07-27 ENCOUNTER — Other Ambulatory Visit: Payer: Self-pay | Admitting: Cardiovascular Disease

## 2016-07-28 NOTE — Telephone Encounter (Signed)
Medication Detail    Disp Refills Start End   nitroGLYCERIN (NITROLINGUAL) 0.4 MG/SPRAY spray 12 g 2 07/26/2016    Sig - Route: Place 1 spray under the tongue every 5 (five) minutes x 3 doses as needed for chest pain. - Sublingual   Notes to Pharmacy: Please consider 90 day supplies to promote better adherence   E-Prescribing Status: Receipt confirmed by pharmacy (07/26/2016 4:54 PM EDT)   Pharmacy   [place], [place].

## 2016-07-29 ENCOUNTER — Other Ambulatory Visit: Payer: Self-pay | Admitting: Cardiovascular Disease

## 2016-09-14 NOTE — Progress Notes (Signed)
Patient ID: Justin Gonzales, male   DOB: 1955/11/13, 61 y.o.   MRN: [id number]   Justin Gonzales is seen for F/U of CAD. He has a previous stent to the RCA and subsequent CABG in 2007  He has not had any recurrent SSCP or SOB. He has normal LV funciton. He was asking for nitrospray instead of the pills. 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 His psoriasis continues to be horrific and stopping his Plavix has not helped. He has not had any , palpitations, SOB, edema or syncope. He has been compliant with his meds His BS has been suboptimally controlled due to dietary indiscretion. DM better than last year with insulin.   Myovue done 1/14 low risk with old inferior scar and EF 51% no ishcemia  Psoriasis is much better with green tea extract  Has son in [place] and daughter in [place] trip back to [place] in July  I see him working out at the [employer] on occasion  Has filed for disability    ROS: Denies fever, malais, weight loss, blurry vision, decreased visual acuity, cough, sputum, SOB, hemoptysis, pleuritic pain, palpitaitons, heartburn, abdominal pain, melena, lower extremity edema, claudication, or rash.  All other systems reviewed and negative  General: Filed Weights   09/16/16 0948  Weight: (!) 311 lb 2 oz (141.1 kg)   BP 130/70   Pulse 86   Ht 6\' 1"  (1.854 m)   Wt (!) 311 lb 2 oz (141.1 kg)   SpO2 93%   BMI 41.05 kg/m  Affect appropriate Overweight [place] male  HEENT: normal Neck supple with no adenopathy JVP normal no bruits no thyromegaly Lungs clear with no wheezing and good diaphragmatic motion Heart:  S1/S2 no murmur, no rub, gallop or click PMI normal Abdomen: benighn, BS positve, no tenderness, no AAA no bruit.  No HSM or HJR Distal pulses intact with no bruits Plus one bilateral edema with stasis  Neuro non-focal Skin psoriatic patches on arms and leg  No muscular  weakness      Allergies  Penicillins  Electrocardiogram:   09/04/13   SR rate 79 normal  03/12/16 SR rate 73 ? Old IMI poor R wave progression   Assessment and Plan CAD/CABG:  2007  No angina compliant with meds non ischemic myovue 2014  Psoriasis:  Improved with green tea extract f/u dermatology  HTN:  Well controlled.  Continue current medications and low sodium Dash type diet.   DM: Discussed low carb diet.  Target hemoglobin A1c is 6.5 or less.  Continue current medications.  Chol:  On statin labs with primary   Edema: dependant from obesity and psoriasis lasix 10 mg called in he will have f/u labs with primary in a few weeks    [name]

## 2016-09-16 ENCOUNTER — Ambulatory Visit (INDEPENDENT_AMBULATORY_CARE_PROVIDER_SITE_OTHER): Payer: PRIVATE HEALTH INSURANCE | Admitting: Cardiovascular Disease

## 2016-09-16 ENCOUNTER — Encounter (INDEPENDENT_AMBULATORY_CARE_PROVIDER_SITE_OTHER): Payer: Self-pay

## 2016-09-16 ENCOUNTER — Encounter: Payer: Self-pay | Admitting: Cardiovascular Disease

## 2016-09-16 VITALS — BP 130/70 | HR 86 | Ht 73.0 in | Wt 311.1 lb

## 2016-09-16 DIAGNOSIS — Z951 Presence of aortocoronary bypass graft: Secondary | ICD-10-CM

## 2016-09-16 MED ORDER — FUROSEMIDE 20 MG PO TABS: 10.0000 mg | ORAL_TABLET | Freq: Every day | ORAL | 11 refills | Status: DC

## 2016-09-16 NOTE — Patient Instructions (Signed)
Medication Instructions:  1. START LASIX 10 MG DAILY (THIS WILL BE A 1/2 OF A 20 MG TABLET)  Labwork: NONE ORDERED  Testing/Procedures: NONE ORDERED  Follow-Up: Your physician wants you to follow-up in: [place] DR. Johnsie Cancel You will receive a reminder letter in the mail two months in advance. If you don't receive a letter, please call our office to schedule the follow-up appointment.   Any Other Special Instructions Will Be Listed Below (If Applicable).     If you need a refill on your cardiac medications before your next appointment, please call your pharmacy.

## 2017-03-31 NOTE — Progress Notes (Signed)
Patient ID: Justin Gonzales, male   DOB: Aug 21, 1955, 62 y.o.   MRN: [id number]   62 y.o. CAD stent RCA then CABG 2007 Normal EF From [place]. On insulin for DM. Does work out at the [employer]. No angina. Compliant with meds Last myovue 2014 non ischemic   Myovue done 1/14 low risk with old inferior scar and EF 51% no ishcemia  Has son in [place] and daughter in [place] trip back to [place]  Last  July  Has filed for disability has lumbago and had injection Concerned about Irbastatan recall  BP readings at home fine compliant with meds   ROS: Denies fever, malais, weight loss, blurry vision, decreased visual acuity, cough, sputum, SOB, hemoptysis, pleuritic pain, palpitaitons, heartburn, abdominal pain, melena, lower extremity edema, claudication, or rash.  All other systems reviewed and negative  General: Filed Weights   04/05/17 0856  Weight: (!) 319 lb 8 oz (144.9 kg)  BP (!) 176/88   Pulse 80   Ht 6\' 1"  (1.854 m)   Wt (!) 319 lb 8 oz (144.9 kg)   SpO2 99%   BMI 42.15 kg/m   Affect appropriate Overweight white male  HEENT: normal Neck supple with no adenopathy JVP normal no bruits no thyromegaly Lungs clear with no wheezing and good diaphragmatic motion Heart:  S1/S2 no murmur, no rub, gallop or click PMI normal Abdomen: benighn, BS positve, no tenderness, no AAA no bruit.  No HSM or HJR Distal pulses intact with no bruits Plus one bilateral  edema Neuro non-focal Skin psoriasis on back arms and legs  No muscular weakness       Allergies  Penicillins  Electrocardiogram:   09/04/13   SR rate 79 normal  03/12/16 SR rate 73 ? Old IMI poor R wave progression  04/05/17 SR rate 72 normal   Assessment and Plan CAD/CABG:  2007  No angina compliant with meds non ischemic myovue 2014  Psoriasis:  Improved with green tea extract f/u dermatology  HTN:  Home readings fine some white coat component change Avapro to Cozaar 100 mg   DM: Discussed low carb  diet.  Target hemoglobin A1c is 6.5 or less.  Continue current medications.  Chol:  On statin labs with primary   Edema: dependant from obesity better with lasix     [name]

## 2017-04-05 ENCOUNTER — Encounter: Payer: Self-pay | Admitting: Cardiovascular Disease

## 2017-04-05 ENCOUNTER — Ambulatory Visit: Payer: PRIVATE HEALTH INSURANCE | Admitting: Cardiovascular Disease

## 2017-04-05 VITALS — BP 150/80 | HR 80 | Ht 73.0 in | Wt 319.5 lb

## 2017-04-05 DIAGNOSIS — Z951 Presence of aortocoronary bypass graft: Secondary | ICD-10-CM

## 2017-04-05 DIAGNOSIS — I25810 Atherosclerosis of coronary artery bypass graft(s) without angina pectoris: Secondary | ICD-10-CM

## 2017-04-05 DIAGNOSIS — E7800 Pure hypercholesterolemia, unspecified: Secondary | ICD-10-CM

## 2017-04-05 DIAGNOSIS — I10 Essential (primary) hypertension: Secondary | ICD-10-CM

## 2017-04-05 MED ORDER — LOSARTAN POTASSIUM 100 MG PO TABS: 100.0000 mg | ORAL_TABLET | Freq: Every day | ORAL | 3 refills | Status: DC

## 2017-04-05 NOTE — Patient Instructions (Addendum)
Medication Instructions:  Your physician has recommended you make the following change in your medication:  1-STOP irbesartan 2-START losartan 100 mg by mouth daily.  Labwork: NONE  Testing/Procedures: NONE  Follow-Up: Your physician wants you to follow-up in: 12 months with Dr. Johnsie Cancel. You will receive a reminder letter in the mail two months in advance. If you don't receive a letter, please call our office to schedule the follow-up appointment.   If you need a refill on your cardiac medications before your next appointment, please call your pharmacy.

## 2017-05-06 ENCOUNTER — Other Ambulatory Visit: Payer: Self-pay | Admitting: Cardiovascular Disease

## 2017-06-14 ENCOUNTER — Other Ambulatory Visit: Payer: Self-pay | Admitting: Cardiovascular Disease

## 2017-10-25 ENCOUNTER — Other Ambulatory Visit: Payer: Self-pay | Admitting: Cardiovascular Disease

## 2018-02-14 ENCOUNTER — Other Ambulatory Visit: Payer: Self-pay | Admitting: Cardiovascular Disease

## 2018-03-21 ENCOUNTER — Encounter: Payer: Self-pay | Admitting: Cardiovascular Disease

## 2018-03-29 ENCOUNTER — Other Ambulatory Visit: Payer: Self-pay | Admitting: Cardiovascular Disease

## 2018-04-06 NOTE — Progress Notes (Signed)
Patient ID: Justin Gonzales, male   DOB: 09-Jun-1955, 63 y.o.   MRN: [id number]     63 y.o. CAD stent RCA then CABG 2007 Normal EF From [place]. On insulin for DM. Does work out at the [employer]. No angina. Compliant with meds Last myovue 2014 non ischemic   Myovue done 1/14 low risk with old inferior scar and EF 51% no ishcemia  Has son in [place] and daughter in [place] trip back to [place]  Last  July  Has filed for disability has lumbago and had injection  BP readings at home fine compliant with meds   My reading with large cuff 140 80 mmHg  Only issue is back pain from obesity and chronic lumbago Has disability hearing next month   ROS: Denies fever, malais, weight loss, blurry vision, decreased visual acuity, cough, sputum, SOB, hemoptysis, pleuritic pain, palpitaitons, heartburn, abdominal pain, melena, lower extremity edema, claudication, or rash.  All other systems reviewed and negative  General: Filed Weights   04/07/18 0843  Weight: (!) 143.3 kg  Pulse 70   Ht 6\' 1"  (1.854 m)   Wt (!) 143.3 kg   SpO2 96%   BMI 41.69 kg/m   Affect appropriate Overweight white male  HEENT: normal Neck supple with no adenopathy JVP normal no bruits no thyromegaly Lungs clear with no wheezing and good diaphragmatic motion Heart:  S1/S2 no murmur, no rub, gallop or click PMI normal Abdomen: benighn, BS positve, no tenderness, no AAA no bruit.  No HSM or HJR Distal pulses intact with no bruits Plus one bilateral  edema Neuro non-focal Skin psoriasis on back arms and legs  No muscular weakness    Allergies  Penicillins  Electrocardiogram:   09/04/13   SR rate 79 normal  03/12/16 SR rate 73 ? Old IMI poor R wave progression  04/05/17 SR rate 72 normal   Assessment and Plan CAD/CABG:  2007  No angina compliant with meds non ischemic myovue 2014  Psoriasis:  Improved with green tea extract f/u dermatology  HTN:  Home readings fine some white coat component  change Avapro to Cozaar 100 mg   DM: Discussed low carb diet.  Target hemoglobin A1c is 6.5 or less.  Continue current medications.  Chol:  On statin labs with primary   Edema: dependant from obesity better with lasix     [name]

## 2018-04-07 ENCOUNTER — Ambulatory Visit: Payer: PRIVATE HEALTH INSURANCE | Admitting: Cardiovascular Disease

## 2018-04-07 ENCOUNTER — Encounter (INDEPENDENT_AMBULATORY_CARE_PROVIDER_SITE_OTHER): Payer: Self-pay

## 2018-04-07 ENCOUNTER — Encounter: Payer: Self-pay | Admitting: Cardiovascular Disease

## 2018-04-07 VITALS — HR 70 | Ht 73.0 in | Wt 316.0 lb

## 2018-04-07 DIAGNOSIS — Z951 Presence of aortocoronary bypass graft: Secondary | ICD-10-CM | POA: Diagnosis not present

## 2018-04-07 DIAGNOSIS — I25810 Atherosclerosis of coronary artery bypass graft(s) without angina pectoris: Secondary | ICD-10-CM

## 2018-04-07 DIAGNOSIS — E7800 Pure hypercholesterolemia, unspecified: Secondary | ICD-10-CM | POA: Diagnosis not present

## 2018-04-07 DIAGNOSIS — I10 Essential (primary) hypertension: Secondary | ICD-10-CM

## 2018-04-07 NOTE — Patient Instructions (Signed)

## 2018-05-16 ENCOUNTER — Other Ambulatory Visit: Payer: Self-pay | Admitting: Internal Medicine

## 2018-05-16 ENCOUNTER — Other Ambulatory Visit: Payer: Self-pay

## 2018-05-16 ENCOUNTER — Ambulatory Visit
Admission: RE | Admit: 2018-05-16 | Discharge: 2018-05-16 | Disposition: A | Discharge status: Home / Self Care | Payer: PRIVATE HEALTH INSURANCE | Source: Ambulatory Visit | Attending: Internal Medicine | Admitting: Internal Medicine

## 2018-05-16 DIAGNOSIS — R609 Edema, unspecified: Secondary | ICD-10-CM

## 2018-05-18 ENCOUNTER — Other Ambulatory Visit: Payer: Self-pay | Admitting: Cardiovascular Disease

## 2018-06-20 ENCOUNTER — Other Ambulatory Visit: Payer: Self-pay | Admitting: Cardiovascular Disease

## 2018-07-06 ENCOUNTER — Other Ambulatory Visit: Payer: Self-pay | Admitting: Cardiovascular Disease

## 2018-07-10 ENCOUNTER — Other Ambulatory Visit: Payer: Self-pay | Admitting: Cardiovascular Disease

## 2018-11-15 ENCOUNTER — Other Ambulatory Visit: Payer: Self-pay | Admitting: Cardiovascular Disease

## 2019-01-04 DIAGNOSIS — I2510 Atherosclerotic heart disease of native coronary artery without angina pectoris: Secondary | ICD-10-CM | POA: Diagnosis not present

## 2019-01-04 DIAGNOSIS — I739 Peripheral vascular disease, unspecified: Secondary | ICD-10-CM | POA: Diagnosis not present

## 2019-01-04 DIAGNOSIS — E782 Mixed hyperlipidemia: Secondary | ICD-10-CM | POA: Diagnosis not present

## 2019-01-11 DIAGNOSIS — I119 Hypertensive heart disease without heart failure: Secondary | ICD-10-CM | POA: Diagnosis not present

## 2019-01-11 DIAGNOSIS — E1165 Type 2 diabetes mellitus with hyperglycemia: Secondary | ICD-10-CM | POA: Diagnosis not present

## 2019-01-11 DIAGNOSIS — E782 Mixed hyperlipidemia: Secondary | ICD-10-CM | POA: Diagnosis not present

## 2019-01-11 DIAGNOSIS — I739 Peripheral vascular disease, unspecified: Secondary | ICD-10-CM | POA: Diagnosis not present

## 2019-02-20 DIAGNOSIS — L400 Psoriasis vulgaris: Secondary | ICD-10-CM | POA: Diagnosis not present

## 2019-04-13 DIAGNOSIS — E782 Mixed hyperlipidemia: Secondary | ICD-10-CM | POA: Diagnosis not present

## 2019-04-13 DIAGNOSIS — I119 Hypertensive heart disease without heart failure: Secondary | ICD-10-CM | POA: Diagnosis not present

## 2019-04-13 DIAGNOSIS — E1165 Type 2 diabetes mellitus with hyperglycemia: Secondary | ICD-10-CM | POA: Diagnosis not present

## 2019-04-13 DIAGNOSIS — Z794 Long term (current) use of insulin: Secondary | ICD-10-CM | POA: Diagnosis not present

## 2019-05-15 NOTE — Progress Notes (Signed)
Patient ID: Justin Gonzales, male   DOB: Jun 23, 1955, 64 y.o.   MRN: [id number]     64 y.o. CAD stent RCA then CABG 2007 Normal EF From [place]. On insulin for DM. Does work out at the [employer]. No angina. Compliant with meds Last myovue 2014 non ischemic   Myovue done 1/14 low risk with old inferior scar and EF 51% no ishcemia  Has son in [place] and daughter in [place] Originally from [place]   Has filed for disability has lumbago    BP readings at home fine compliant with meds   He finally got disability but for PTSD Weight is up  No angina   Gym membership to [employer] is free Has had COVID Vaccine     ROS: Denies fever, malais, weight loss, blurry vision, decreased visual acuity, cough, sputum, SOB, hemoptysis, pleuritic pain, palpitaitons, heartburn, abdominal pain, melena, lower extremity edema, claudication, or rash.  All other systems reviewed and negative  General: Filed Weights   05/21/19 0951  Weight: (!) 336 lb (152.4 kg)  BP (!) 150/90   Pulse 84   Ht 6\' 1"  (1.854 m)   Wt (!) 336 lb (152.4 kg)   SpO2 98%   BMI 44.33 kg/m   Affect appropriate Overweight white male  HEENT: normal Neck supple with no adenopathy JVP normal no bruits no thyromegaly Lungs clear with no wheezing and good diaphragmatic motion Heart:  S1/S2 no murmur, no rub, gallop or click PMI normal Abdomen: benighn, BS positve, no tenderness, no AAA no bruit.  No HSM or HJR Distal pulses intact with no bruits Plus one bilateral  edema Neuro non-focal Skin psoriasis on back arms and legs  No muscular weakness    Allergies  Penicillins  Electrocardiogram:   04/05/17 SR rate 72 normal 05/21/19 SR rate 84 normal isolated PVC  Assessment and Plan CAD/CABG:  2007  No angina compliant with meds non ischemic myovue 2014  Psoriasis:  Improved with green tea extract f/u dermatology  HTN:  Home readings fine some white coat component continue current meds weight loss important    DM: Discussed low carb diet.  Target hemoglobin A1c is 6.5 or less.  Continue current medications.  Chol:  On statin labs with primary   Edema: dependant from obesity better with lasix     [name]

## 2019-05-21 ENCOUNTER — Encounter: Payer: Self-pay | Admitting: Cardiovascular Disease

## 2019-05-21 ENCOUNTER — Ambulatory Visit: Payer: Medicare HMO | Admitting: Cardiovascular Disease

## 2019-05-21 ENCOUNTER — Other Ambulatory Visit: Payer: Self-pay

## 2019-05-21 VITALS — BP 150/90 | HR 84 | Ht 73.0 in | Wt 336.0 lb

## 2019-05-21 DIAGNOSIS — Z951 Presence of aortocoronary bypass graft: Secondary | ICD-10-CM

## 2019-05-21 NOTE — Patient Instructions (Signed)
Medication Instructions:  *If you need a refill on your cardiac medications before your next appointment, please call your pharmacy*  Lab Work: If you have labs (blood work) drawn today and your tests are completely normal, you will receive your results only by: . MyChart Message (if you have MyChart) OR . A paper copy in the mail If you have any lab test that is abnormal or we need to change your treatment, we will call you to review the results.  Follow-Up: At [facility], you and your health needs are our priority.  As part of our continuing mission to provide you with exceptional heart care, we have created designated Provider Care Teams.  These Care Teams include your primary Cardiologist (physician) and Advanced Practice Providers (APPs -  Physician Assistants and Nurse Practitioners) who all work together to provide you with the care you need, when you need it.  We recommend signing up for the patient portal called "MyChart".  Sign up information is provided on this After Visit Summary.  MyChart is used to connect with patients for Virtual Visits (Telemedicine).  Patients are able to view lab/test results, encounter notes, upcoming appointments, etc.  Non-urgent messages can be sent to your provider as well.   To learn more about what you can do with MyChart, go to [email or web address].    Your next appointment:   12 month(s)  The format for your next appointment:   In Person  Provider:   You may see Dr. Nishan or one of the following Advanced Practice Providers on your designated Care Team:    Lori Gerhardt, NP  Laura Ingold, NP  Jill McDaniel, NP   

## 2019-05-22 NOTE — Addendum Note (Signed)
Addended by: Jacinta Shoe on: 05/22/2019 10:52 AM   Modules accepted: Orders

## 2019-06-04 DIAGNOSIS — H5213 Myopia, bilateral: Secondary | ICD-10-CM | POA: Diagnosis not present

## 2019-06-11 DIAGNOSIS — H35033 Hypertensive retinopathy, bilateral: Secondary | ICD-10-CM | POA: Diagnosis not present

## 2019-06-11 DIAGNOSIS — H35372 Puckering of macula, left eye: Secondary | ICD-10-CM | POA: Diagnosis not present

## 2019-06-11 DIAGNOSIS — H43823 Vitreomacular adhesion, bilateral: Secondary | ICD-10-CM | POA: Diagnosis not present

## 2019-06-11 DIAGNOSIS — E113413 Type 2 diabetes mellitus with severe nonproliferative diabetic retinopathy with macular edema, bilateral: Secondary | ICD-10-CM | POA: Diagnosis not present

## 2019-06-18 DIAGNOSIS — E113413 Type 2 diabetes mellitus with severe nonproliferative diabetic retinopathy with macular edema, bilateral: Secondary | ICD-10-CM | POA: Diagnosis not present

## 2019-06-28 DIAGNOSIS — I25810 Atherosclerosis of coronary artery bypass graft(s) without angina pectoris: Secondary | ICD-10-CM | POA: Diagnosis not present

## 2019-06-28 DIAGNOSIS — E1165 Type 2 diabetes mellitus with hyperglycemia: Secondary | ICD-10-CM | POA: Diagnosis not present

## 2019-06-28 DIAGNOSIS — I739 Peripheral vascular disease, unspecified: Secondary | ICD-10-CM | POA: Diagnosis not present

## 2019-06-28 DIAGNOSIS — E782 Mixed hyperlipidemia: Secondary | ICD-10-CM | POA: Diagnosis not present

## 2019-06-28 DIAGNOSIS — Z0000 Encounter for general adult medical examination without abnormal findings: Secondary | ICD-10-CM | POA: Diagnosis not present

## 2019-06-28 DIAGNOSIS — I119 Hypertensive heart disease without heart failure: Secondary | ICD-10-CM | POA: Diagnosis not present

## 2019-06-28 DIAGNOSIS — Z125 Encounter for screening for malignant neoplasm of prostate: Secondary | ICD-10-CM | POA: Diagnosis not present

## 2019-07-05 DIAGNOSIS — I739 Peripheral vascular disease, unspecified: Secondary | ICD-10-CM | POA: Diagnosis not present

## 2019-07-05 DIAGNOSIS — E782 Mixed hyperlipidemia: Secondary | ICD-10-CM | POA: Diagnosis not present

## 2019-07-05 DIAGNOSIS — R319 Hematuria, unspecified: Secondary | ICD-10-CM | POA: Diagnosis not present

## 2019-07-05 DIAGNOSIS — E1165 Type 2 diabetes mellitus with hyperglycemia: Secondary | ICD-10-CM | POA: Diagnosis not present

## 2019-07-05 DIAGNOSIS — Z0000 Encounter for general adult medical examination without abnormal findings: Secondary | ICD-10-CM | POA: Diagnosis not present

## 2019-07-05 DIAGNOSIS — E1151 Type 2 diabetes mellitus with diabetic peripheral angiopathy without gangrene: Secondary | ICD-10-CM | POA: Diagnosis not present

## 2019-07-05 DIAGNOSIS — I119 Hypertensive heart disease without heart failure: Secondary | ICD-10-CM | POA: Diagnosis not present

## 2019-07-05 DIAGNOSIS — I25810 Atherosclerosis of coronary artery bypass graft(s) without angina pectoris: Secondary | ICD-10-CM | POA: Diagnosis not present

## 2019-07-05 DIAGNOSIS — I2510 Atherosclerotic heart disease of native coronary artery without angina pectoris: Secondary | ICD-10-CM | POA: Diagnosis not present

## 2019-07-16 ENCOUNTER — Other Ambulatory Visit: Payer: Self-pay | Admitting: *Deleted

## 2019-07-16 MED ORDER — NITROGLYCERIN 0.4 MG/SPRAY TL SOLN: 1.0000 | 1 refills | Status: DC | PRN

## 2019-07-18 ENCOUNTER — Telehealth: Payer: Self-pay

## 2019-07-18 NOTE — Telephone Encounter (Signed)
They should call Dr Chalmers Cater not [place] and tell him not to prescribe Viagra Call patient He has history of stent/CABG should not take Viagra

## 2019-07-18 NOTE — Telephone Encounter (Signed)
Will send message to Dr. Johnsie Cancel so he is aware that patient has been prescribed Sildenafil by Dr. Chalmers Cater and he is also on nitro.

## 2019-07-18 NOTE — Telephone Encounter (Signed)
[facility] mail order pharmacy requesting clarification because pt takes nitroglycerin and pt was prescribed by Dr. Chalmers Cater sildenafil on 05/2019 and may have an interaction that may cause severe hypotensuion or MI. Pharmacy would like Dr. Johnsie Cancel to confirm that he is aware of interaction and ok to fill these medications. Please address

## 2019-07-18 NOTE — Telephone Encounter (Signed)
Called patient to let him know Dr. Johnsie Cancel recommendations. Per Dr. Johnsie Cancel he has history of stent/CABG should not take Viagra. Patient verbalized understanding.

## 2019-07-27 DIAGNOSIS — E113413 Type 2 diabetes mellitus with severe nonproliferative diabetic retinopathy with macular edema, bilateral: Secondary | ICD-10-CM | POA: Diagnosis not present

## 2019-07-27 DIAGNOSIS — H43823 Vitreomacular adhesion, bilateral: Secondary | ICD-10-CM | POA: Diagnosis not present

## 2019-07-27 DIAGNOSIS — H35033 Hypertensive retinopathy, bilateral: Secondary | ICD-10-CM | POA: Diagnosis not present

## 2019-07-27 DIAGNOSIS — H35372 Puckering of macula, left eye: Secondary | ICD-10-CM | POA: Diagnosis not present

## 2019-08-09 IMAGING — US RIGHT LOWER EXTREMITY VENOUS ULTRASOUND
1 series · 13 of 24 positions shown · non-contrast
Comparison: None.

CLINICAL DATA: 63-year-old male with a history of right calf
discoloration



[Series 1: right lower extremity venous ultrasound · 0.12mm/px · 13 of 34 slices shown]
[im 1/34]
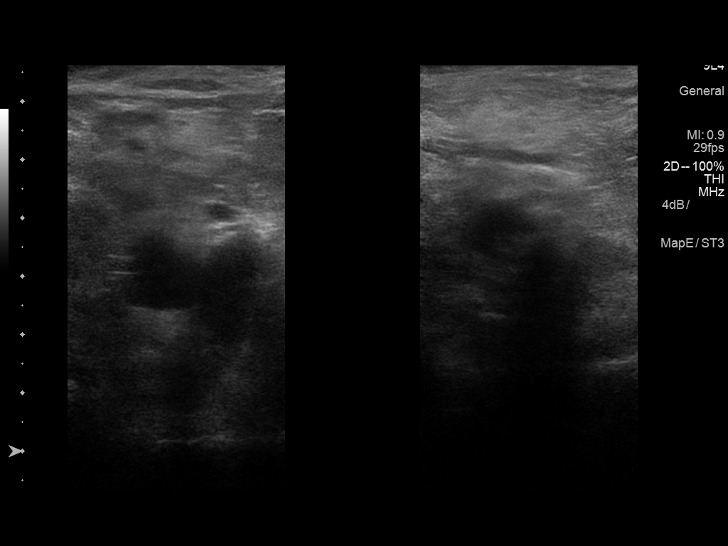
[im 3/34]
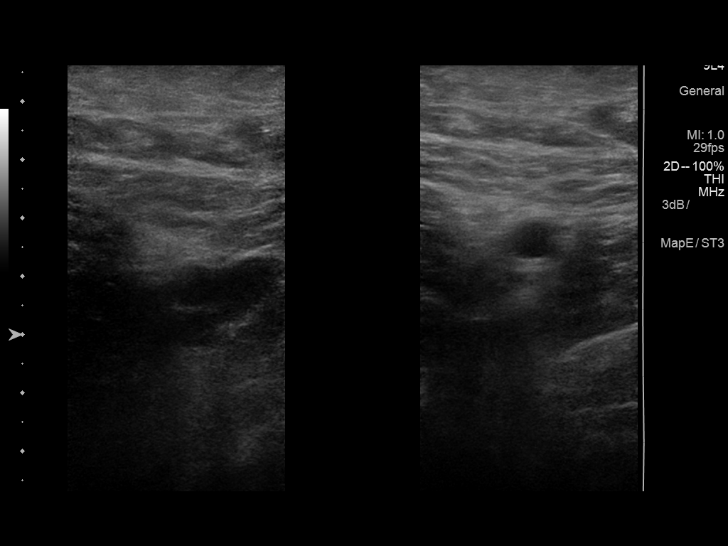
[im 6/34]
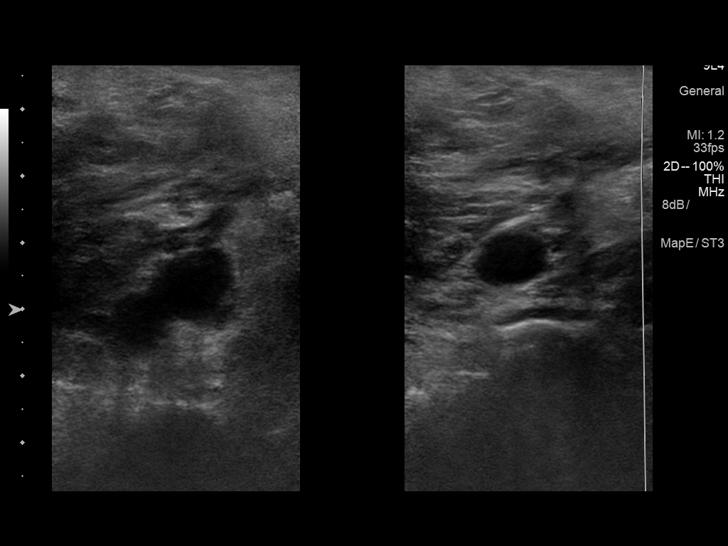
[im 9/34]
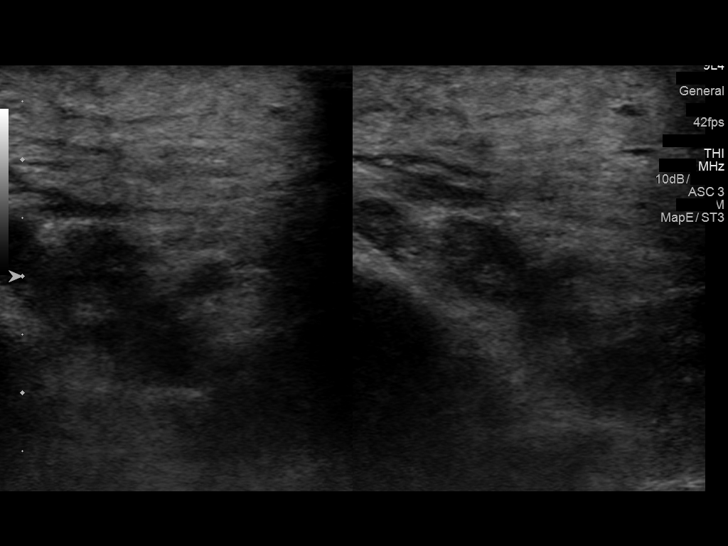
[im 12/34]
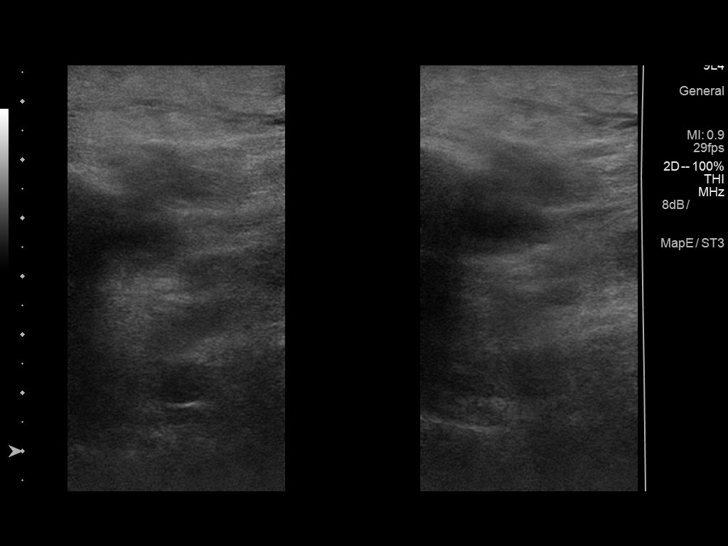
[im 15/34]
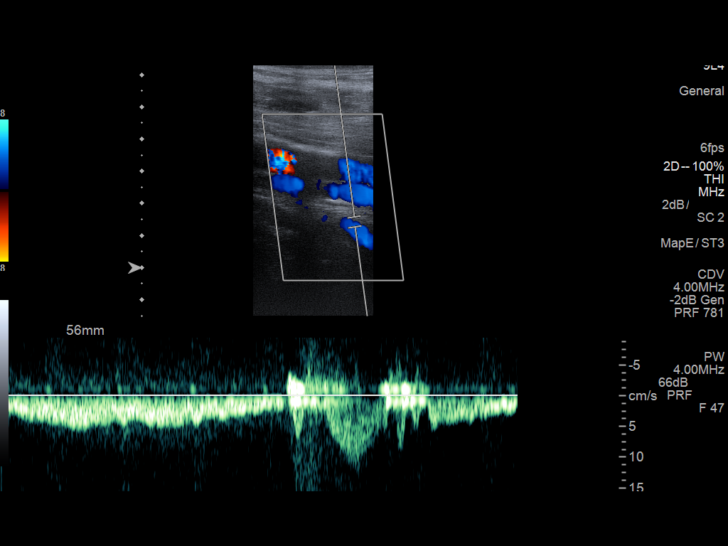
[im 18/34]
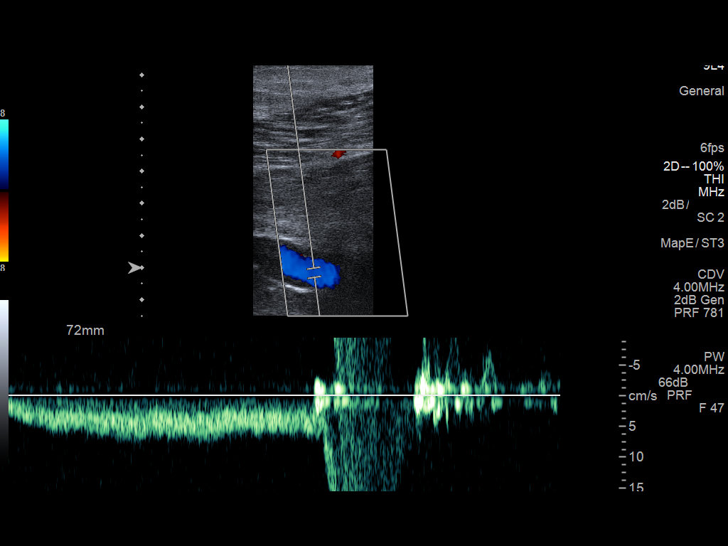
[im 19/34]
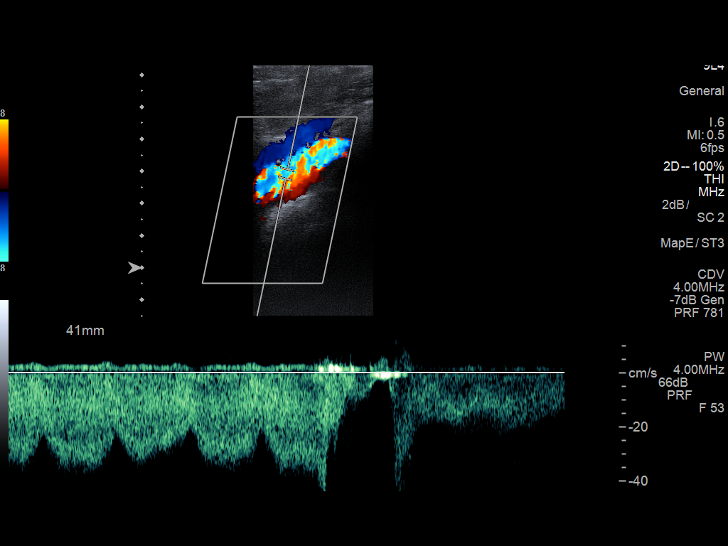
[im 22/34]
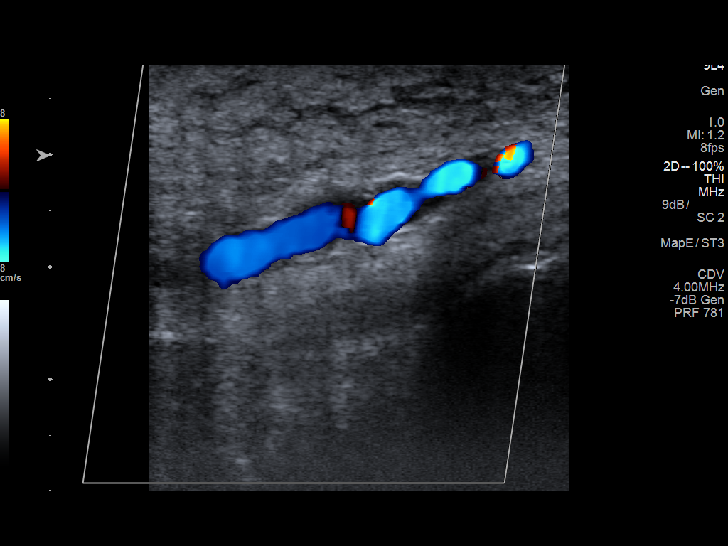
[im 25/34]
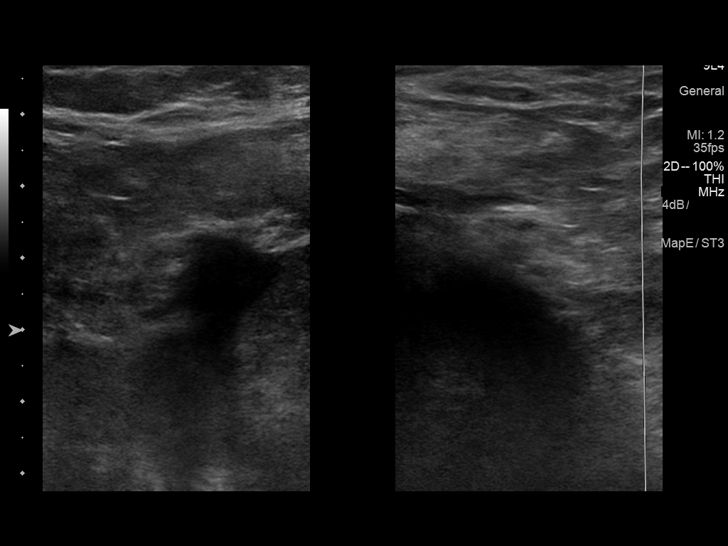
[im 28/34]
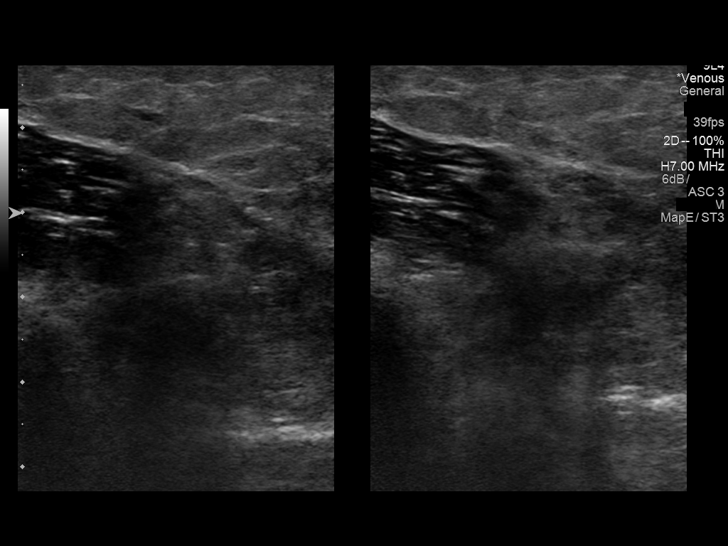
[im 31/34]
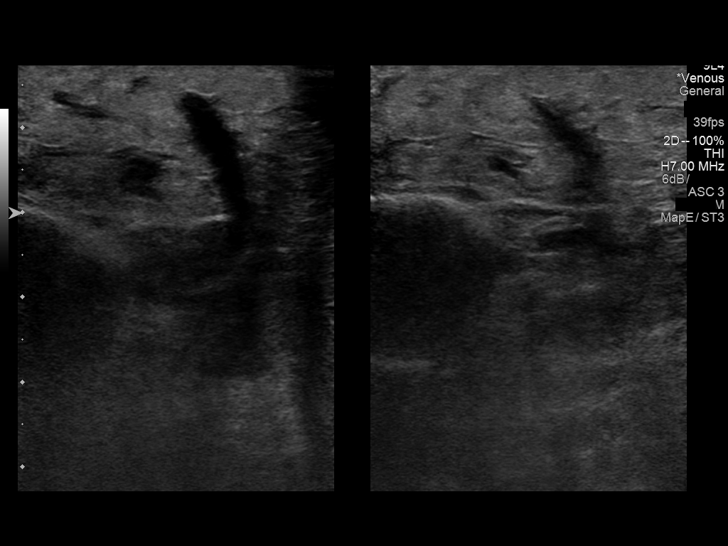
[im 34/34]
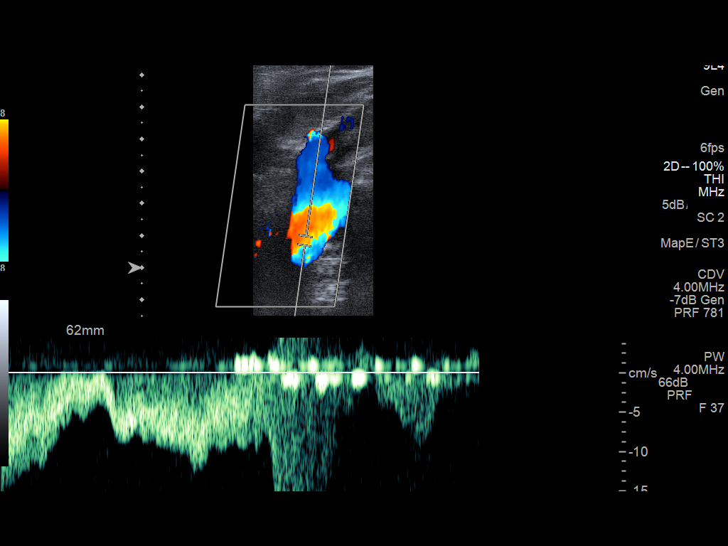

[13 of 24 positions shown; findings below may reference images not displayed]

FINDINGS: Contralateral Common Femoral Vein: Respiratory phasicity is normal
and symmetric with the symptomatic side. No evidence of thrombus.
Normal compressibility.

Common Femoral Vein: No evidence of thrombus. Normal
compressibility, respiratory phasicity and response to augmentation.

Saphenofemoral Junction: No evidence of thrombus. Normal
compressibility and flow on color Doppler imaging.

Profunda Femoral Vein: No evidence of thrombus. Normal
compressibility and flow on color Doppler imaging.

Femoral Vein: No evidence of thrombus. Normal compressibility,
respiratory phasicity and response to augmentation.

Popliteal Vein: No evidence of thrombus. Normal compressibility,
respiratory phasicity and response to augmentation.

Calf Veins: No evidence of thrombus. Normal compressibility and flow
on color Doppler imaging.

Superficial Great Saphenous Vein: No evidence of thrombus. Normal
compressibility and flow on color Doppler imaging.

Other Findings:  Edema
IMPRESSION: Sonographic survey of the right extremity negative for DVT.

Edema

## 2019-08-15 ENCOUNTER — Ambulatory Visit: Payer: Medicare HMO | Admitting: Podiatry

## 2019-08-15 ENCOUNTER — Other Ambulatory Visit: Payer: Self-pay

## 2019-08-15 ENCOUNTER — Encounter: Payer: Self-pay | Admitting: Podiatry

## 2019-08-15 DIAGNOSIS — B351 Tinea unguium: Secondary | ICD-10-CM

## 2019-08-15 DIAGNOSIS — G629 Polyneuropathy, unspecified: Secondary | ICD-10-CM | POA: Diagnosis not present

## 2019-08-15 DIAGNOSIS — M79676 Pain in unspecified toe(s): Secondary | ICD-10-CM

## 2019-08-15 DIAGNOSIS — E0843 Diabetes mellitus due to underlying condition with diabetic autonomic (poly)neuropathy: Secondary | ICD-10-CM | POA: Diagnosis not present

## 2019-08-15 NOTE — Progress Notes (Signed)
   HPI: 64 y.o. male PMHx diabetes mellitus type II presenting today for diabetic foot evaluation and to see about getting diabetic shoes. Patient states that his shoes are very uncomfortable despite trying different pairs and models.  Patient is concerned because he has diabetes and he develops ulcerative callus lesions as well.  He presents as a new patient for further treatment evaluation  Past Medical History:  Diagnosis Date  . Anxiety   . Coronary artery disease 2007   Stent to RCA and subsequent CABG   . DM (diabetes mellitus) ([place])   . HTN (hypertension)   . Hypercholesterolemia      Physical Exam: General: The patient is alert and oriented x3 in no acute distress.  Dermatology: Skin is warm, dry and supple bilateral lower extremities. Negative for open lesions or macerations.  Hyperkeratotic preulcerative callus lesion noted to the distal tip of the left second toe.  Hyperkeratotic dystrophic nails also noted 1-5 bilateral  Vascular: Palpable pedal pulses bilaterally.  There does appear to be some chronic edema to the bilateral lower extremities with hemosiderin deposit consistent with venous insufficiency. Capillary refill within normal limits.  Neurological: Epicritic and protective threshold diminished bilaterally.   Musculoskeletal Exam: Range of motion within normal limits to all pedal and ankle joints bilateral. Muscle strength 5/5 in all groups bilateral.   Assessment: 1.  Diabetes mellitus with peripheral polyneuropathy 2.  Chronic venous insufficiency 3.  Preulcerative callus lesion left second toe 4.  Pain due to onychomycosis of toenail bilateral   Plan of Care:  1. Patient evaluated.  2.  Appointment with Pedorthist for diabetic insoles and shoes 3.  Recommend good foot hygiene.  Patient states that his wife trims his nails.  He would not like me to debride his nails today 4.  Return to clinic annually      Edrick Kins, DPM [facility]  Dr. Edrick Kins, DPM    [address], [place] [address]                Office [phone]  Fax [phone]

## 2019-08-24 DIAGNOSIS — E113413 Type 2 diabetes mellitus with severe nonproliferative diabetic retinopathy with macular edema, bilateral: Secondary | ICD-10-CM | POA: Diagnosis not present

## 2019-08-31 ENCOUNTER — Other Ambulatory Visit: Payer: Self-pay

## 2019-08-31 ENCOUNTER — Other Ambulatory Visit: Payer: Medicare HMO | Admitting: Orthotics

## 2019-09-03 DIAGNOSIS — L400 Psoriasis vulgaris: Secondary | ICD-10-CM | POA: Diagnosis not present

## 2019-09-21 DIAGNOSIS — H35033 Hypertensive retinopathy, bilateral: Secondary | ICD-10-CM | POA: Diagnosis not present

## 2019-09-21 DIAGNOSIS — H43823 Vitreomacular adhesion, bilateral: Secondary | ICD-10-CM | POA: Diagnosis not present

## 2019-09-21 DIAGNOSIS — H35372 Puckering of macula, left eye: Secondary | ICD-10-CM | POA: Diagnosis not present

## 2019-09-21 DIAGNOSIS — E113413 Type 2 diabetes mellitus with severe nonproliferative diabetic retinopathy with macular edema, bilateral: Secondary | ICD-10-CM | POA: Diagnosis not present

## 2019-09-25 ENCOUNTER — Ambulatory Visit: Payer: Medicare HMO | Admitting: Orthotics

## 2019-09-25 ENCOUNTER — Other Ambulatory Visit: Payer: Self-pay

## 2019-09-25 DIAGNOSIS — E0843 Diabetes mellitus due to underlying condition with diabetic autonomic (poly)neuropathy: Secondary | ICD-10-CM

## 2019-09-25 DIAGNOSIS — M2042 Other hammer toe(s) (acquired), left foot: Secondary | ICD-10-CM

## 2019-09-25 DIAGNOSIS — M2040 Other hammer toe(s) (acquired), unspecified foot: Secondary | ICD-10-CM

## 2019-09-25 DIAGNOSIS — G629 Polyneuropathy, unspecified: Secondary | ICD-10-CM

## 2019-09-25 DIAGNOSIS — M2041 Other hammer toe(s) (acquired), right foot: Secondary | ICD-10-CM

## 2019-09-25 DIAGNOSIS — E0849 Diabetes mellitus due to underlying condition with other diabetic neurological complication: Secondary | ICD-10-CM

## 2019-09-25 NOTE — Progress Notes (Signed)

## 2019-10-16 ENCOUNTER — Other Ambulatory Visit: Payer: Self-pay | Admitting: Internal Medicine

## 2019-10-16 DIAGNOSIS — I2510 Atherosclerotic heart disease of native coronary artery without angina pectoris: Secondary | ICD-10-CM | POA: Diagnosis not present

## 2019-10-16 DIAGNOSIS — I878 Other specified disorders of veins: Secondary | ICD-10-CM | POA: Diagnosis not present

## 2019-10-16 DIAGNOSIS — I739 Peripheral vascular disease, unspecified: Secondary | ICD-10-CM | POA: Diagnosis not present

## 2019-10-16 DIAGNOSIS — R600 Localized edema: Secondary | ICD-10-CM | POA: Diagnosis not present

## 2019-10-17 ENCOUNTER — Ambulatory Visit
Admission: RE | Admit: 2019-10-17 | Discharge: 2019-10-17 | Disposition: A | Discharge status: Home / Self Care | Payer: Medicare HMO | Source: Ambulatory Visit | Attending: Internal Medicine | Admitting: Internal Medicine

## 2019-10-17 DIAGNOSIS — R600 Localized edema: Secondary | ICD-10-CM | POA: Diagnosis not present

## 2019-10-19 DIAGNOSIS — E1165 Type 2 diabetes mellitus with hyperglycemia: Secondary | ICD-10-CM | POA: Diagnosis not present

## 2019-10-19 DIAGNOSIS — E782 Mixed hyperlipidemia: Secondary | ICD-10-CM | POA: Diagnosis not present

## 2019-10-19 DIAGNOSIS — R809 Proteinuria, unspecified: Secondary | ICD-10-CM | POA: Diagnosis not present

## 2019-10-19 DIAGNOSIS — Z23 Encounter for immunization: Secondary | ICD-10-CM | POA: Diagnosis not present

## 2019-10-19 DIAGNOSIS — I119 Hypertensive heart disease without heart failure: Secondary | ICD-10-CM | POA: Diagnosis not present

## 2019-10-22 DIAGNOSIS — H35372 Puckering of macula, left eye: Secondary | ICD-10-CM | POA: Diagnosis not present

## 2019-10-22 DIAGNOSIS — H43823 Vitreomacular adhesion, bilateral: Secondary | ICD-10-CM | POA: Diagnosis not present

## 2019-10-22 DIAGNOSIS — H35033 Hypertensive retinopathy, bilateral: Secondary | ICD-10-CM | POA: Diagnosis not present

## 2019-10-22 DIAGNOSIS — E113413 Type 2 diabetes mellitus with severe nonproliferative diabetic retinopathy with macular edema, bilateral: Secondary | ICD-10-CM | POA: Diagnosis not present

## 2019-11-19 DIAGNOSIS — E113413 Type 2 diabetes mellitus with severe nonproliferative diabetic retinopathy with macular edema, bilateral: Secondary | ICD-10-CM | POA: Diagnosis not present

## 2019-12-06 DIAGNOSIS — I739 Peripheral vascular disease, unspecified: Secondary | ICD-10-CM | POA: Diagnosis not present

## 2019-12-06 DIAGNOSIS — E1165 Type 2 diabetes mellitus with hyperglycemia: Secondary | ICD-10-CM | POA: Diagnosis not present

## 2019-12-06 DIAGNOSIS — E782 Mixed hyperlipidemia: Secondary | ICD-10-CM | POA: Diagnosis not present

## 2019-12-06 DIAGNOSIS — R319 Hematuria, unspecified: Secondary | ICD-10-CM | POA: Diagnosis not present

## 2019-12-06 DIAGNOSIS — I2510 Atherosclerotic heart disease of native coronary artery without angina pectoris: Secondary | ICD-10-CM | POA: Diagnosis not present

## 2019-12-13 DIAGNOSIS — I878 Other specified disorders of veins: Secondary | ICD-10-CM | POA: Diagnosis not present

## 2019-12-13 DIAGNOSIS — E1151 Type 2 diabetes mellitus with diabetic peripheral angiopathy without gangrene: Secondary | ICD-10-CM | POA: Diagnosis not present

## 2019-12-13 DIAGNOSIS — R319 Hematuria, unspecified: Secondary | ICD-10-CM | POA: Diagnosis not present

## 2019-12-13 DIAGNOSIS — R600 Localized edema: Secondary | ICD-10-CM | POA: Diagnosis not present

## 2019-12-13 DIAGNOSIS — E1165 Type 2 diabetes mellitus with hyperglycemia: Secondary | ICD-10-CM | POA: Diagnosis not present

## 2019-12-13 DIAGNOSIS — I119 Hypertensive heart disease without heart failure: Secondary | ICD-10-CM | POA: Diagnosis not present

## 2019-12-17 DIAGNOSIS — H35372 Puckering of macula, left eye: Secondary | ICD-10-CM | POA: Diagnosis not present

## 2019-12-17 DIAGNOSIS — E113412 Type 2 diabetes mellitus with severe nonproliferative diabetic retinopathy with macular edema, left eye: Secondary | ICD-10-CM | POA: Diagnosis not present

## 2019-12-17 DIAGNOSIS — H35033 Hypertensive retinopathy, bilateral: Secondary | ICD-10-CM | POA: Diagnosis not present

## 2019-12-17 DIAGNOSIS — E113413 Type 2 diabetes mellitus with severe nonproliferative diabetic retinopathy with macular edema, bilateral: Secondary | ICD-10-CM | POA: Diagnosis not present

## 2019-12-17 DIAGNOSIS — H43823 Vitreomacular adhesion, bilateral: Secondary | ICD-10-CM | POA: Diagnosis not present

## 2020-01-01 DIAGNOSIS — E113412 Type 2 diabetes mellitus with severe nonproliferative diabetic retinopathy with macular edema, left eye: Secondary | ICD-10-CM | POA: Diagnosis not present

## 2020-01-14 ENCOUNTER — Other Ambulatory Visit: Payer: Self-pay | Admitting: Cardiovascular Disease

## 2020-01-15 DIAGNOSIS — E113413 Type 2 diabetes mellitus with severe nonproliferative diabetic retinopathy with macular edema, bilateral: Secondary | ICD-10-CM | POA: Diagnosis not present

## 2020-01-15 DIAGNOSIS — E113412 Type 2 diabetes mellitus with severe nonproliferative diabetic retinopathy with macular edema, left eye: Secondary | ICD-10-CM | POA: Diagnosis not present

## 2020-02-12 DIAGNOSIS — E113413 Type 2 diabetes mellitus with severe nonproliferative diabetic retinopathy with macular edema, bilateral: Secondary | ICD-10-CM | POA: Diagnosis not present

## 2020-02-12 DIAGNOSIS — E113412 Type 2 diabetes mellitus with severe nonproliferative diabetic retinopathy with macular edema, left eye: Secondary | ICD-10-CM | POA: Diagnosis not present

## 2020-02-13 DIAGNOSIS — I739 Peripheral vascular disease, unspecified: Secondary | ICD-10-CM | POA: Diagnosis not present

## 2020-02-13 DIAGNOSIS — I119 Hypertensive heart disease without heart failure: Secondary | ICD-10-CM | POA: Diagnosis not present

## 2020-02-13 DIAGNOSIS — F39 Unspecified mood [affective] disorder: Secondary | ICD-10-CM | POA: Diagnosis not present

## 2020-02-13 DIAGNOSIS — E1165 Type 2 diabetes mellitus with hyperglycemia: Secondary | ICD-10-CM | POA: Diagnosis not present

## 2020-03-17 DIAGNOSIS — E113413 Type 2 diabetes mellitus with severe nonproliferative diabetic retinopathy with macular edema, bilateral: Secondary | ICD-10-CM | POA: Diagnosis not present

## 2020-03-17 DIAGNOSIS — H35372 Puckering of macula, left eye: Secondary | ICD-10-CM | POA: Diagnosis not present

## 2020-03-17 DIAGNOSIS — H35033 Hypertensive retinopathy, bilateral: Secondary | ICD-10-CM | POA: Diagnosis not present

## 2020-03-17 DIAGNOSIS — H43823 Vitreomacular adhesion, bilateral: Secondary | ICD-10-CM | POA: Diagnosis not present

## 2020-03-31 DIAGNOSIS — E113412 Type 2 diabetes mellitus with severe nonproliferative diabetic retinopathy with macular edema, left eye: Secondary | ICD-10-CM | POA: Diagnosis not present

## 2020-04-17 DIAGNOSIS — E782 Mixed hyperlipidemia: Secondary | ICD-10-CM | POA: Diagnosis not present

## 2020-04-17 DIAGNOSIS — E1165 Type 2 diabetes mellitus with hyperglycemia: Secondary | ICD-10-CM | POA: Diagnosis not present

## 2020-04-17 DIAGNOSIS — I119 Hypertensive heart disease without heart failure: Secondary | ICD-10-CM | POA: Diagnosis not present

## 2020-05-14 DIAGNOSIS — E1165 Type 2 diabetes mellitus with hyperglycemia: Secondary | ICD-10-CM | POA: Diagnosis not present

## 2020-05-14 DIAGNOSIS — I739 Peripheral vascular disease, unspecified: Secondary | ICD-10-CM | POA: Diagnosis not present

## 2020-05-14 DIAGNOSIS — I119 Hypertensive heart disease without heart failure: Secondary | ICD-10-CM | POA: Diagnosis not present

## 2020-05-21 DIAGNOSIS — I119 Hypertensive heart disease without heart failure: Secondary | ICD-10-CM | POA: Diagnosis not present

## 2020-05-21 DIAGNOSIS — I739 Peripheral vascular disease, unspecified: Secondary | ICD-10-CM | POA: Diagnosis not present

## 2020-05-21 DIAGNOSIS — I872 Venous insufficiency (chronic) (peripheral): Secondary | ICD-10-CM | POA: Diagnosis not present

## 2020-05-21 DIAGNOSIS — E1151 Type 2 diabetes mellitus with diabetic peripheral angiopathy without gangrene: Secondary | ICD-10-CM | POA: Diagnosis not present

## 2020-05-21 DIAGNOSIS — E782 Mixed hyperlipidemia: Secondary | ICD-10-CM | POA: Diagnosis not present

## 2020-05-27 DIAGNOSIS — E113413 Type 2 diabetes mellitus with severe nonproliferative diabetic retinopathy with macular edema, bilateral: Secondary | ICD-10-CM | POA: Diagnosis not present

## 2020-05-27 DIAGNOSIS — H43823 Vitreomacular adhesion, bilateral: Secondary | ICD-10-CM | POA: Diagnosis not present

## 2020-08-05 DIAGNOSIS — H5789 Other specified disorders of eye and adnexa: Secondary | ICD-10-CM | POA: Diagnosis not present

## 2020-08-10 NOTE — Progress Notes (Signed)
Patient ID: Justin Gonzales, male   DOB: 1955/08/06, 65 y.o.   MRN: [id number]     65 y.o. CAD stent RCA then CABG 2007 Normal EF On insulin for DM. Does work out at [employer] No angina. 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  All other systems reviewed and negative  General:  There were no vitals taken for this visit.  Affect appropriate Overweight white male  HEENT: normal Neck supple with no adenopathy JVP normal no bruits no thyromegaly Lungs clear with no wheezing and good diaphragmatic motion Heart:  S1/S2 no murmur, no rub, gallop or click PMI normal Abdomen: benighn, BS positve, no tenderness, no AAA no bruit.  No HSM or HJR Distal pulses intact with no bruits Plus 2 bilateral  edema with echymotic discoloration  Neuro non-focal Skin psoriasis on back arms and legs  No muscular weakness   Allergies  Penicillins  Electrocardiogram:   08/19/2020 SR rate 74 nonspecific ST changes   Assessment and Plan CAD/CABG:  2007 : Compliant with meds last myovue 2014 scar inferior wall no ischemia favor updating Exercise myovue given risk factors and DM   Psoriasis:  Improved with green tea extract f/u dermatology  HTN:  Home readings fine some white coat component continue current meds weight loss  important   DM: Discussed low carb diet.  Target hemoglobin A1c is 6.5 or less.  Continue current medications.  Chol:  On statin labs with primary   Edema: dependant from obesity and venous reflux Lasix 20 mg called in encouraged compliance   Lexiscan Myovue   Lasix 20 mg daily    F/U in a year if low risk   [name]

## 2020-08-19 ENCOUNTER — Other Ambulatory Visit: Payer: Self-pay

## 2020-08-19 ENCOUNTER — Ambulatory Visit: Payer: HMO | Admitting: Cardiovascular Disease

## 2020-08-19 VITALS — BP 154/80 | HR 74 | Ht 73.0 in | Wt 334.0 lb

## 2020-08-19 DIAGNOSIS — R600 Localized edema: Secondary | ICD-10-CM

## 2020-08-19 DIAGNOSIS — R609 Edema, unspecified: Secondary | ICD-10-CM

## 2020-08-19 DIAGNOSIS — E118 Type 2 diabetes mellitus with unspecified complications: Secondary | ICD-10-CM | POA: Diagnosis not present

## 2020-08-19 DIAGNOSIS — Z951 Presence of aortocoronary bypass graft: Secondary | ICD-10-CM

## 2020-08-19 DIAGNOSIS — I10 Essential (primary) hypertension: Secondary | ICD-10-CM

## 2020-08-19 MED ORDER — FUROSEMIDE 20 MG PO TABS: 20.0000 mg | ORAL_TABLET | Freq: Every day | ORAL | 3 refills | Status: DC

## 2020-08-19 NOTE — Patient Instructions (Addendum)
Medication Instructions:  Your physician has recommended you make the following change in your medication:  1-TAKE Lasix 20 mg by mouth daily.  *If you need a refill on your cardiac medications before your next appointment, please call your pharmacy*  Lab Work: If you have labs (blood work) drawn today and your tests are completely normal, you will receive your results only by: [place] (if you have MyChart) OR A paper copy in the mail If you have any lab test that is abnormal or we need to change your treatment, we will call you to review the results.  Testing/Procedures: Your physician has requested that you have a lexiscan myoview. For further information please visit [email or web address]. Please follow instruction sheet, as given.  Follow-Up: At [facility], you and your health needs are our priority.  As part of our continuing mission to provide you with exceptional heart care, we have created designated Provider Care Teams.  These Care Teams include your primary Cardiologist (physician) and Advanced Practice Providers (APPs -  Physician Assistants and Nurse Practitioners) who all work together to provide you with the care you need, when you need it.  We recommend signing up for the patient portal called "MyChart".  Sign up information is provided on this After Visit Summary.  MyChart is used to connect with patients for Virtual Visits (Telemedicine).  Patients are able to view lab/test results, encounter notes, upcoming appointments, etc.  Non-urgent messages can be sent to your provider as well.   To learn more about what you can do with MyChart, go to [email or web address].    Your next appointment:   12 month(s)  The format for your next appointment:   In Person  Provider:   You may see Dr. Johnsie Cancel or one of the following Advanced Practice Providers on your designated Care Team:   Cecilie Kicks, NP

## 2020-08-19 NOTE — Addendum Note (Signed)
Addended by: Aris Georgia,  L on: 08/19/2020 02:54 PM   Modules accepted: Orders

## 2020-08-20 ENCOUNTER — Other Ambulatory Visit: Payer: Self-pay

## 2020-08-20 DIAGNOSIS — Z951 Presence of aortocoronary bypass graft: Secondary | ICD-10-CM

## 2020-08-20 DIAGNOSIS — I10 Essential (primary) hypertension: Secondary | ICD-10-CM

## 2020-08-20 DIAGNOSIS — R609 Edema, unspecified: Secondary | ICD-10-CM

## 2020-08-20 DIAGNOSIS — E118 Type 2 diabetes mellitus with unspecified complications: Secondary | ICD-10-CM

## 2020-08-20 NOTE — Addendum Note (Signed)
Addended by: Claude Manges on: 08/20/2020 02:57 PM   Modules accepted: Orders

## 2020-08-20 NOTE — Progress Notes (Signed)
Will forward to Dr. Johnsie Cancel to sign order for consent.

## 2020-09-01 ENCOUNTER — Telehealth (HOSPITAL_COMMUNITY): Payer: Self-pay | Admitting: *Deleted

## 2020-09-01 DIAGNOSIS — R5383 Other fatigue: Secondary | ICD-10-CM | POA: Diagnosis not present

## 2020-09-01 DIAGNOSIS — Z0000 Encounter for general adult medical examination without abnormal findings: Secondary | ICD-10-CM | POA: Diagnosis not present

## 2020-09-01 DIAGNOSIS — E1165 Type 2 diabetes mellitus with hyperglycemia: Secondary | ICD-10-CM | POA: Diagnosis not present

## 2020-09-01 DIAGNOSIS — R39198 Other difficulties with micturition: Secondary | ICD-10-CM | POA: Diagnosis not present

## 2020-09-01 DIAGNOSIS — E782 Mixed hyperlipidemia: Secondary | ICD-10-CM | POA: Diagnosis not present

## 2020-09-01 NOTE — Telephone Encounter (Signed)
Left message on voicemail per DPR in reference to upcoming appointment scheduled on 09/08/20 at 8:15 with detailed instructions given per Myocardial Perfusion Study Information Sheet for the test. LM to arrive 15 minutes early, and that it is imperative to arrive on time for appointment to keep from having the test rescheduled. If you need to cancel or reschedule your appointment, please call the office within 24 hours of your appointment. Failure to do so may result in a cancellation of your appointment, and a $50 no show fee. Phone number given for call back for any questions.

## 2020-09-02 ENCOUNTER — Telehealth: Payer: Self-pay | Admitting: Cardiovascular Disease

## 2020-09-02 NOTE — Telephone Encounter (Signed)
   *  STAT* If patient is at the pharmacy, call can be transferred to refill team.   1. Which medications need to be refilled? (please list name of each medication and dose if known)   atorvastatin (LIPITOR) 80 MG tablet    metoprolol succinate (TOPROL-XL) 50 MG 24 hr tablet   2. Which pharmacy/location (including street and city if local pharmacy) is medication to be sent to? [place], [place].  3. Do they need a 30 day or 90 day supply? 90 days

## 2020-09-05 MED ORDER — METOPROLOL SUCCINATE ER 50 MG PO TB24: ORAL_TABLET | ORAL | 3 refills | Status: DC

## 2020-09-05 MED ORDER — ATORVASTATIN CALCIUM 80 MG PO TABS: ORAL_TABLET | ORAL | 3 refills | Status: DC

## 2020-09-05 NOTE — Telephone Encounter (Signed)
Refill for Atorvastatin & Metoprolol has been sent to [facility], per pt request.

## 2020-09-05 NOTE — Telephone Encounter (Signed)
Patient is following up. He will be completely out of medication by the weekend. Please assist.    *STAT* If patient is at the pharmacy, call can be transferred to refill team.   1. Which medications need to be refilled? (please list name of each medication and dose if known)  atorvastatin (LIPITOR) 80 MG tablet metoprolol succinate (TOPROL-XL) 50 MG 24 hr tablet  2. Which pharmacy/location (including street and city if local pharmacy) is medication to be sent to? [place], [place].  3. Do they need a 30 day or 90 day supply?  90 day supply

## 2020-09-08 ENCOUNTER — Telehealth: Payer: Self-pay | Admitting: Podiatry

## 2020-09-08 ENCOUNTER — Ambulatory Visit (HOSPITAL_COMMUNITY): Payer: HMO

## 2020-09-08 NOTE — Telephone Encounter (Signed)
Pts daughter left message stating pt tested positive for covid and they are canceling the appt for 8.10 and will call back to r/s

## 2020-09-09 ENCOUNTER — Ambulatory Visit (HOSPITAL_COMMUNITY): Payer: HMO

## 2020-09-09 ENCOUNTER — Encounter (HOSPITAL_COMMUNITY): Payer: Self-pay

## 2020-09-09 ENCOUNTER — Encounter (HOSPITAL_COMMUNITY): Payer: HMO

## 2020-09-10 ENCOUNTER — Ambulatory Visit: Payer: HMO | Admitting: Podiatry

## 2020-09-15 DIAGNOSIS — I2510 Atherosclerotic heart disease of native coronary artery without angina pectoris: Secondary | ICD-10-CM | POA: Diagnosis not present

## 2020-09-15 DIAGNOSIS — E1151 Type 2 diabetes mellitus with diabetic peripheral angiopathy without gangrene: Secondary | ICD-10-CM | POA: Diagnosis not present

## 2020-09-15 DIAGNOSIS — I739 Peripheral vascular disease, unspecified: Secondary | ICD-10-CM | POA: Diagnosis not present

## 2020-09-15 DIAGNOSIS — E782 Mixed hyperlipidemia: Secondary | ICD-10-CM | POA: Diagnosis not present

## 2020-09-15 DIAGNOSIS — I25810 Atherosclerosis of coronary artery bypass graft(s) without angina pectoris: Secondary | ICD-10-CM | POA: Diagnosis not present

## 2020-09-17 ENCOUNTER — Encounter (HOSPITAL_COMMUNITY): Payer: Self-pay | Admitting: *Deleted

## 2020-09-17 ENCOUNTER — Telehealth (HOSPITAL_COMMUNITY): Payer: Self-pay | Admitting: *Deleted

## 2020-09-17 NOTE — Telephone Encounter (Signed)
Left message on voicemail in reference to upcoming appointment scheduled for  09/24/20 Phone number given for a call back so details instructions can be given.  Letter with instructions sent via my chart.  Kirstie Peri

## 2020-09-22 ENCOUNTER — Ambulatory Visit: Payer: HMO | Admitting: Podiatry

## 2020-09-22 ENCOUNTER — Other Ambulatory Visit: Payer: Self-pay

## 2020-09-22 DIAGNOSIS — G629 Polyneuropathy, unspecified: Secondary | ICD-10-CM

## 2020-09-22 DIAGNOSIS — E0843 Diabetes mellitus due to underlying condition with diabetic autonomic (poly)neuropathy: Secondary | ICD-10-CM | POA: Diagnosis not present

## 2020-09-22 DIAGNOSIS — E119 Type 2 diabetes mellitus without complications: Secondary | ICD-10-CM | POA: Diagnosis not present

## 2020-09-22 NOTE — Progress Notes (Signed)
   HPI: 65 y.o. male PMHx diabetes mellitus type II presenting today for diabetic foot evaluation and to see about getting diabetic shoes. Patient states that his shoes are very uncomfortable despite trying different pairs and models.  Patient is concerned because he has diabetes and he develops ulcerative callus lesions as well.  He presents as a new patient for further treatment evaluation  Past Medical History:  Diagnosis Date   Anxiety    Coronary artery disease 2007   Stent to RCA and subsequent CABG    DM (diabetes mellitus) ([place])    HTN (hypertension)    Hypercholesterolemia      Physical Exam: General: The patient is alert and oriented x3 in no acute distress.  Dermatology: Skin is warm, dry and supple bilateral lower extremities. Negative for open lesions or macerations.  Hyperkeratotic preulcerative callus lesion noted to the distal tip of the left second toe.  Hyperkeratotic dystrophic nails also noted 1-5 bilateral  Vascular: Palpable pedal pulses bilaterally.  There does appear to be some chronic edema to the bilateral lower extremities with hemosiderin deposit consistent with venous insufficiency. Capillary refill within normal limits.  Neurological: Epicritic and protective threshold diminished bilaterally.   Musculoskeletal Exam: Range of motion within normal limits to all pedal and ankle joints bilateral. Muscle strength 5/5 in all groups bilateral.   Assessment: 1.  Diabetes mellitus with peripheral polyneuropathy 2.  Chronic venous insufficiency 3.  Preulcerative callus lesion left second toe 4.  Pain due to onychomycosis of toenail bilateral   Plan of Care:  1. Patient evaluated.  2.  Patient was molded today for diabetic insoles and shoes 3.  Recommend good foot hygiene.  Patient states that his wife trims his nails.  He would not like me to debride his nails today 4.  Return to clinic in 3-4 weeks for diabetic shoes [place]  5.  Return to clinic with me  annually      Edrick Kins, DPM [facility]  Dr. Edrick Kins, DPM    [address], [place] [address]                Office [phone]  Fax [phone]

## 2020-09-22 NOTE — Progress Notes (Signed)
Patient presents to the office today for diabetic shoe and insole measuring.  Patient was measured with brannock device to determine size and width for 1 pair of extra depth shoes and foam casted for 3 pair of insoles.   Documentation of medical necessity will be sent to patient's treating diabetic doctor to verify and sign.   Patient's diabetic provider: Dr. Jacelyn Pi  Shoes and insoles will be ordered at that time and patient will be notified for an appointment for fitting when they arrive.   Shoe size (per patient): 13   Brannock measurement: RIGHT - 12 E, LEFT - 12.5 E  Patient shoe selection-   1st choice:   NB MW [id number]  2nd choice:  Apex [id number]   Shoe size ordered: Men's 13 X-Wide

## 2020-09-24 ENCOUNTER — Other Ambulatory Visit: Payer: Self-pay

## 2020-09-24 ENCOUNTER — Ambulatory Visit (HOSPITAL_COMMUNITY): Payer: HMO | Attending: Cardiology

## 2020-09-24 DIAGNOSIS — Z951 Presence of aortocoronary bypass graft: Secondary | ICD-10-CM | POA: Diagnosis not present

## 2020-09-24 DIAGNOSIS — I25810 Atherosclerosis of coronary artery bypass graft(s) without angina pectoris: Secondary | ICD-10-CM

## 2020-09-24 MED ORDER — REGADENOSON 0.4 MG/5ML IV SOLN
0.4000 mg | Freq: Once | INTRAVENOUS | Status: AC
  Administered 2020-09-24: 0.4 mg via INTRAVENOUS

## 2020-09-24 MED ORDER — TECHNETIUM TC 99M TETROFOSMIN IV KIT
32.4000 | PACK | Freq: Once | INTRAVENOUS | Status: AC | PRN
  Administered 2020-09-24: 32.4 via INTRAVENOUS
  Filled 2020-09-24: qty 33

## 2020-09-25 ENCOUNTER — Ambulatory Visit (HOSPITAL_COMMUNITY): Payer: HMO | Attending: Cardiovascular Disease

## 2020-09-25 LAB — MYOCARDIAL PERFUSION IMAGING
Base ST Depression (mm): 0 mm
LV dias vol: 181 mL (ref 62–150)
LV sys vol: 109 mL
Nuc Stress EF: 40 %
Peak HR: 98 {beats}/min
Rest HR: 77 {beats}/min
Rest Nuclear Isotope Dose: 32.2 mCi
SDS: 1
SRS: 1
SSS: 11
ST Depression (mm): 0 mm
Stress Nuclear Isotope Dose: 32.4 mCi
TID: 0.99

## 2020-09-25 MED ORDER — TECHNETIUM TC 99M TETROFOSMIN IV KIT
32.2000 | PACK | Freq: Once | INTRAVENOUS | Status: AC | PRN
  Administered 2020-09-25: 32.2 via INTRAVENOUS
  Filled 2020-09-25: qty 33

## 2020-09-26 ENCOUNTER — Telehealth: Payer: Self-pay | Admitting: Cardiovascular Disease

## 2020-09-26 DIAGNOSIS — H35033 Hypertensive retinopathy, bilateral: Secondary | ICD-10-CM | POA: Diagnosis not present

## 2020-09-26 DIAGNOSIS — H43823 Vitreomacular adhesion, bilateral: Secondary | ICD-10-CM | POA: Diagnosis not present

## 2020-09-26 DIAGNOSIS — E113413 Type 2 diabetes mellitus with severe nonproliferative diabetic retinopathy with macular edema, bilateral: Secondary | ICD-10-CM | POA: Diagnosis not present

## 2020-09-26 DIAGNOSIS — H35372 Puckering of macula, left eye: Secondary | ICD-10-CM | POA: Diagnosis not present

## 2020-09-26 NOTE — Telephone Encounter (Signed)
Follow Up:     Patient's daughter called and would the like the Patient's Stress Test results please.

## 2020-09-30 NOTE — Telephone Encounter (Signed)
Pt's daughter (Not on DPR) is returning call in regards to receiving pt's results from his recent stress test. Pt's daughter Gwynneth Aliment would like to know if it's ok for our office to have her on the phone when the call is returned because of the language barrier.  Vanita Panda number is [phone] Pt's number is [phone]

## 2020-10-01 MED ORDER — NITROGLYCERIN 0.4 MG/SPRAY TL SOLN: 1 refills | Status: DC

## 2020-10-01 NOTE — Telephone Encounter (Signed)
Per Dr. Johnsie Cancel  Old IMI simillar to prev myovue continue medical Rx    Called patient with results. Patient gave verbal consent to talk to his daughter about results.  Called patient's daughter with results also.

## 2020-10-14 DIAGNOSIS — L400 Psoriasis vulgaris: Secondary | ICD-10-CM | POA: Diagnosis not present

## 2020-10-15 ENCOUNTER — Telehealth: Payer: Self-pay | Admitting: Podiatry

## 2020-10-15 DIAGNOSIS — L400 Psoriasis vulgaris: Secondary | ICD-10-CM | POA: Diagnosis not present

## 2020-10-15 NOTE — Telephone Encounter (Signed)
Received fax from [employer] for [place] for diabetic shoes/inserts(A5500 X2/[id number] X6) auth # [id number] valid 9.14.2022 thru 12.13.2022.Marland Kitchen

## 2020-10-20 DIAGNOSIS — I10 Essential (primary) hypertension: Secondary | ICD-10-CM | POA: Diagnosis not present

## 2020-10-20 DIAGNOSIS — I2510 Atherosclerotic heart disease of native coronary artery without angina pectoris: Secondary | ICD-10-CM | POA: Diagnosis not present

## 2020-10-20 DIAGNOSIS — E1165 Type 2 diabetes mellitus with hyperglycemia: Secondary | ICD-10-CM | POA: Diagnosis not present

## 2020-10-20 DIAGNOSIS — E782 Mixed hyperlipidemia: Secondary | ICD-10-CM | POA: Diagnosis not present

## 2020-11-19 DIAGNOSIS — I2510 Atherosclerotic heart disease of native coronary artery without angina pectoris: Secondary | ICD-10-CM | POA: Diagnosis not present

## 2020-11-19 DIAGNOSIS — L409 Psoriasis, unspecified: Secondary | ICD-10-CM | POA: Diagnosis not present

## 2020-11-19 DIAGNOSIS — E782 Mixed hyperlipidemia: Secondary | ICD-10-CM | POA: Diagnosis not present

## 2020-11-19 DIAGNOSIS — R809 Proteinuria, unspecified: Secondary | ICD-10-CM | POA: Diagnosis not present

## 2020-11-19 DIAGNOSIS — I739 Peripheral vascular disease, unspecified: Secondary | ICD-10-CM | POA: Diagnosis not present

## 2020-11-19 DIAGNOSIS — I10 Essential (primary) hypertension: Secondary | ICD-10-CM | POA: Diagnosis not present

## 2020-11-19 DIAGNOSIS — I872 Venous insufficiency (chronic) (peripheral): Secondary | ICD-10-CM | POA: Diagnosis not present

## 2020-11-19 DIAGNOSIS — Z0000 Encounter for general adult medical examination without abnormal findings: Secondary | ICD-10-CM | POA: Diagnosis not present

## 2020-11-19 DIAGNOSIS — I119 Hypertensive heart disease without heart failure: Secondary | ICD-10-CM | POA: Diagnosis not present

## 2020-11-19 DIAGNOSIS — E1151 Type 2 diabetes mellitus with diabetic peripheral angiopathy without gangrene: Secondary | ICD-10-CM | POA: Diagnosis not present

## 2020-11-20 ENCOUNTER — Ambulatory Visit (INDEPENDENT_AMBULATORY_CARE_PROVIDER_SITE_OTHER): Payer: HMO

## 2020-11-20 ENCOUNTER — Other Ambulatory Visit: Payer: Self-pay

## 2020-11-20 DIAGNOSIS — E1140 Type 2 diabetes mellitus with diabetic neuropathy, unspecified: Secondary | ICD-10-CM | POA: Diagnosis not present

## 2020-11-20 DIAGNOSIS — E1342 Other specified diabetes mellitus with diabetic polyneuropathy: Secondary | ICD-10-CM | POA: Diagnosis not present

## 2020-11-20 DIAGNOSIS — E0843 Diabetes mellitus due to underlying condition with diabetic autonomic (poly)neuropathy: Secondary | ICD-10-CM

## 2020-11-20 DIAGNOSIS — L84 Corns and callosities: Secondary | ICD-10-CM | POA: Diagnosis not present

## 2020-11-20 NOTE — Progress Notes (Signed)
Patient in office today to pick-up diabetic shoes and custom inserts. Patient tried on the shoes and inserts and was satisfied with the fit and feel of both. Educated the patient on the break-in process and return policy at this time. Patient verbalized understanding. Advised patient to call the office with any questions, comments, or concerns.

## 2020-12-01 DIAGNOSIS — E782 Mixed hyperlipidemia: Secondary | ICD-10-CM | POA: Diagnosis not present

## 2020-12-01 DIAGNOSIS — I2510 Atherosclerotic heart disease of native coronary artery without angina pectoris: Secondary | ICD-10-CM | POA: Diagnosis not present

## 2020-12-01 DIAGNOSIS — E1165 Type 2 diabetes mellitus with hyperglycemia: Secondary | ICD-10-CM | POA: Diagnosis not present

## 2020-12-01 DIAGNOSIS — I10 Essential (primary) hypertension: Secondary | ICD-10-CM | POA: Diagnosis not present

## 2020-12-31 DIAGNOSIS — E782 Mixed hyperlipidemia: Secondary | ICD-10-CM | POA: Diagnosis not present

## 2020-12-31 DIAGNOSIS — I2510 Atherosclerotic heart disease of native coronary artery without angina pectoris: Secondary | ICD-10-CM | POA: Diagnosis not present

## 2020-12-31 DIAGNOSIS — E1165 Type 2 diabetes mellitus with hyperglycemia: Secondary | ICD-10-CM | POA: Diagnosis not present

## 2020-12-31 DIAGNOSIS — I10 Essential (primary) hypertension: Secondary | ICD-10-CM | POA: Diagnosis not present

## 2021-01-09 DIAGNOSIS — H43823 Vitreomacular adhesion, bilateral: Secondary | ICD-10-CM | POA: Diagnosis not present

## 2021-01-09 DIAGNOSIS — E113413 Type 2 diabetes mellitus with severe nonproliferative diabetic retinopathy with macular edema, bilateral: Secondary | ICD-10-CM | POA: Diagnosis not present

## 2021-01-09 DIAGNOSIS — H35372 Puckering of macula, left eye: Secondary | ICD-10-CM | POA: Diagnosis not present

## 2021-01-09 DIAGNOSIS — H35033 Hypertensive retinopathy, bilateral: Secondary | ICD-10-CM | POA: Diagnosis not present

## 2021-02-05 DIAGNOSIS — L400 Psoriasis vulgaris: Secondary | ICD-10-CM | POA: Diagnosis not present

## 2021-03-03 DIAGNOSIS — I2510 Atherosclerotic heart disease of native coronary artery without angina pectoris: Secondary | ICD-10-CM | POA: Diagnosis not present

## 2021-03-03 DIAGNOSIS — E1165 Type 2 diabetes mellitus with hyperglycemia: Secondary | ICD-10-CM | POA: Diagnosis not present

## 2021-03-03 DIAGNOSIS — E782 Mixed hyperlipidemia: Secondary | ICD-10-CM | POA: Diagnosis not present

## 2021-03-03 DIAGNOSIS — I10 Essential (primary) hypertension: Secondary | ICD-10-CM | POA: Diagnosis not present

## 2021-03-05 DIAGNOSIS — L400 Psoriasis vulgaris: Secondary | ICD-10-CM | POA: Diagnosis not present

## 2021-03-31 DIAGNOSIS — I10 Essential (primary) hypertension: Secondary | ICD-10-CM | POA: Diagnosis not present

## 2021-03-31 DIAGNOSIS — E782 Mixed hyperlipidemia: Secondary | ICD-10-CM | POA: Diagnosis not present

## 2021-03-31 DIAGNOSIS — E1165 Type 2 diabetes mellitus with hyperglycemia: Secondary | ICD-10-CM | POA: Diagnosis not present

## 2021-03-31 DIAGNOSIS — I2510 Atherosclerotic heart disease of native coronary artery without angina pectoris: Secondary | ICD-10-CM | POA: Diagnosis not present

## 2021-04-27 DIAGNOSIS — E1165 Type 2 diabetes mellitus with hyperglycemia: Secondary | ICD-10-CM | POA: Diagnosis not present

## 2021-04-27 DIAGNOSIS — I10 Essential (primary) hypertension: Secondary | ICD-10-CM | POA: Diagnosis not present

## 2021-04-27 DIAGNOSIS — E782 Mixed hyperlipidemia: Secondary | ICD-10-CM | POA: Diagnosis not present

## 2021-05-20 DIAGNOSIS — I119 Hypertensive heart disease without heart failure: Secondary | ICD-10-CM | POA: Diagnosis not present

## 2021-05-20 DIAGNOSIS — E1151 Type 2 diabetes mellitus with diabetic peripheral angiopathy without gangrene: Secondary | ICD-10-CM | POA: Diagnosis not present

## 2021-05-20 DIAGNOSIS — I2510 Atherosclerotic heart disease of native coronary artery without angina pectoris: Secondary | ICD-10-CM | POA: Diagnosis not present

## 2021-05-20 DIAGNOSIS — E782 Mixed hyperlipidemia: Secondary | ICD-10-CM | POA: Diagnosis not present

## 2021-05-20 DIAGNOSIS — I10 Essential (primary) hypertension: Secondary | ICD-10-CM | POA: Diagnosis not present

## 2021-05-28 DIAGNOSIS — L400 Psoriasis vulgaris: Secondary | ICD-10-CM | POA: Diagnosis not present

## 2021-05-31 DIAGNOSIS — E782 Mixed hyperlipidemia: Secondary | ICD-10-CM | POA: Diagnosis not present

## 2021-05-31 DIAGNOSIS — I2510 Atherosclerotic heart disease of native coronary artery without angina pectoris: Secondary | ICD-10-CM | POA: Diagnosis not present

## 2021-05-31 DIAGNOSIS — I10 Essential (primary) hypertension: Secondary | ICD-10-CM | POA: Diagnosis not present

## 2021-05-31 DIAGNOSIS — E1165 Type 2 diabetes mellitus with hyperglycemia: Secondary | ICD-10-CM | POA: Diagnosis not present

## 2021-06-02 DIAGNOSIS — I2510 Atherosclerotic heart disease of native coronary artery without angina pectoris: Secondary | ICD-10-CM | POA: Diagnosis not present

## 2021-06-02 DIAGNOSIS — R809 Proteinuria, unspecified: Secondary | ICD-10-CM | POA: Diagnosis not present

## 2021-06-02 DIAGNOSIS — I25810 Atherosclerosis of coronary artery bypass graft(s) without angina pectoris: Secondary | ICD-10-CM | POA: Diagnosis not present

## 2021-06-02 DIAGNOSIS — I10 Essential (primary) hypertension: Secondary | ICD-10-CM | POA: Diagnosis not present

## 2021-06-02 DIAGNOSIS — E1151 Type 2 diabetes mellitus with diabetic peripheral angiopathy without gangrene: Secondary | ICD-10-CM | POA: Diagnosis not present

## 2021-06-02 DIAGNOSIS — I739 Peripheral vascular disease, unspecified: Secondary | ICD-10-CM | POA: Diagnosis not present

## 2021-06-02 DIAGNOSIS — I872 Venous insufficiency (chronic) (peripheral): Secondary | ICD-10-CM | POA: Diagnosis not present

## 2021-06-02 DIAGNOSIS — E782 Mixed hyperlipidemia: Secondary | ICD-10-CM | POA: Diagnosis not present

## 2021-06-02 DIAGNOSIS — Z23 Encounter for immunization: Secondary | ICD-10-CM | POA: Diagnosis not present

## 2021-06-02 DIAGNOSIS — L409 Psoriasis, unspecified: Secondary | ICD-10-CM | POA: Diagnosis not present

## 2021-06-02 DIAGNOSIS — I119 Hypertensive heart disease without heart failure: Secondary | ICD-10-CM | POA: Diagnosis not present

## 2021-06-08 DIAGNOSIS — H43823 Vitreomacular adhesion, bilateral: Secondary | ICD-10-CM | POA: Diagnosis not present

## 2021-06-08 DIAGNOSIS — E113413 Type 2 diabetes mellitus with severe nonproliferative diabetic retinopathy with macular edema, bilateral: Secondary | ICD-10-CM | POA: Diagnosis not present

## 2021-06-08 DIAGNOSIS — H35033 Hypertensive retinopathy, bilateral: Secondary | ICD-10-CM | POA: Diagnosis not present

## 2021-06-08 DIAGNOSIS — H35372 Puckering of macula, left eye: Secondary | ICD-10-CM | POA: Diagnosis not present

## 2021-06-09 DIAGNOSIS — D485 Neoplasm of uncertain behavior of skin: Secondary | ICD-10-CM | POA: Diagnosis not present

## 2021-06-09 DIAGNOSIS — L404 Guttate psoriasis: Secondary | ICD-10-CM | POA: Diagnosis not present

## 2021-06-09 DIAGNOSIS — Z961 Presence of intraocular lens: Secondary | ICD-10-CM | POA: Diagnosis not present

## 2021-06-09 DIAGNOSIS — H0289 Other specified disorders of eyelid: Secondary | ICD-10-CM | POA: Diagnosis not present

## 2021-06-09 DIAGNOSIS — H02834 Dermatochalasis of left upper eyelid: Secondary | ICD-10-CM | POA: Diagnosis not present

## 2021-06-09 DIAGNOSIS — H02831 Dermatochalasis of right upper eyelid: Secondary | ICD-10-CM | POA: Diagnosis not present

## 2021-07-21 DIAGNOSIS — D23122 Other benign neoplasm of skin of left lower eyelid, including canthus: Secondary | ICD-10-CM | POA: Diagnosis not present

## 2021-07-21 DIAGNOSIS — R238 Other skin changes: Secondary | ICD-10-CM | POA: Diagnosis not present

## 2021-07-21 DIAGNOSIS — L918 Other hypertrophic disorders of the skin: Secondary | ICD-10-CM | POA: Diagnosis not present

## 2021-07-21 DIAGNOSIS — D481 Neoplasm of uncertain behavior of connective and other soft tissue: Secondary | ICD-10-CM | POA: Diagnosis not present

## 2021-08-08 ENCOUNTER — Other Ambulatory Visit: Payer: Self-pay | Admitting: Cardiovascular Disease

## 2021-08-12 DIAGNOSIS — D3102 Benign neoplasm of left conjunctiva: Secondary | ICD-10-CM | POA: Diagnosis not present

## 2021-08-20 DIAGNOSIS — L400 Psoriasis vulgaris: Secondary | ICD-10-CM | POA: Diagnosis not present

## 2021-08-22 ENCOUNTER — Emergency Department (HOSPITAL_BASED_OUTPATIENT_CLINIC_OR_DEPARTMENT_OTHER): Payer: HMO

## 2021-08-22 ENCOUNTER — Other Ambulatory Visit: Payer: Self-pay

## 2021-08-22 ENCOUNTER — Encounter (HOSPITAL_COMMUNITY): Payer: Self-pay | Admitting: Emergency Medicine

## 2021-08-22 ENCOUNTER — Emergency Department (HOSPITAL_COMMUNITY)
Admission: EM | Admit: 2021-08-22 | Discharge: 2021-08-22 | Disposition: A | Discharge status: Home / Self Care | Payer: HMO | Attending: Emergency Medicine | Admitting: Emergency Medicine

## 2021-08-22 DIAGNOSIS — M79605 Pain in left leg: Secondary | ICD-10-CM | POA: Diagnosis not present

## 2021-08-22 DIAGNOSIS — L03116 Cellulitis of left lower limb: Secondary | ICD-10-CM | POA: Diagnosis not present

## 2021-08-22 DIAGNOSIS — R600 Localized edema: Secondary | ICD-10-CM | POA: Diagnosis not present

## 2021-08-22 DIAGNOSIS — M7989 Other specified soft tissue disorders: Secondary | ICD-10-CM

## 2021-08-22 DIAGNOSIS — Z7982 Long term (current) use of aspirin: Secondary | ICD-10-CM | POA: Diagnosis not present

## 2021-08-22 LAB — CBC WITH DIFFERENTIAL/PLATELET
Abs Immature Granulocytes: 0.02 10*3/uL (ref 0.00–0.07)
Basophils Absolute: 0 10*3/uL (ref 0.0–0.1)
Basophils Relative: 0 %
Eosinophils Absolute: 0 10*3/uL (ref 0.0–0.5)
Eosinophils Relative: 1 %
HCT: 43.3 % (ref 39.0–52.0)
Hemoglobin: 14.8 g/dL (ref 13.0–17.0)
Immature Granulocytes: 0 %
Lymphocytes Relative: 24 %
Lymphs Abs: 1.8 10*3/uL (ref 0.7–4.0)
MCH: 31.6 pg (ref 26.0–34.0)
MCHC: 34.2 g/dL (ref 30.0–36.0)
MCV: 92.5 fL (ref 80.0–100.0)
Monocytes Absolute: 1.1 10*3/uL — ABNORMAL HIGH (ref 0.1–1.0)
Monocytes Relative: 15 %
Neutro Abs: 4.6 10*3/uL (ref 1.7–7.7)
Neutrophils Relative %: 60 %
Platelets: 142 10*3/uL — ABNORMAL LOW (ref 150–400)
RBC: 4.68 MIL/uL (ref 4.22–5.81)
RDW: 12.6 % (ref 11.5–15.5)
WBC: 7.6 10*3/uL (ref 4.0–10.5)
nRBC: 0 % (ref 0.0–0.2)

## 2021-08-22 LAB — COMPREHENSIVE METABOLIC PANEL
ALT: 26 U/L (ref 0–44)
AST: 26 U/L (ref 15–41)
Albumin: 3.5 g/dL (ref 3.5–5.0)
Alkaline Phosphatase: 76 U/L (ref 38–126)
Anion gap: 9 (ref 5–15)
BUN: 19 mg/dL (ref 8–23)
CO2: 27 mmol/L (ref 22–32)
Calcium: 9.2 mg/dL (ref 8.9–10.3)
Chloride: 100 mmol/L (ref 98–111)
Creatinine, Ser: 1.09 mg/dL (ref 0.61–1.24)
GFR, Estimated: >60 mL/min (ref >60)
Glucose, Bld: 159 mg/dL — ABNORMAL HIGH (ref 70–99)
Potassium: 4 mmol/L (ref 3.5–5.1)
Sodium: 136 mmol/L (ref 135–145)
Total Bilirubin: 0.9 mg/dL (ref 0.3–1.2)
Total Protein: 7.3 g/dL (ref 6.5–8.1)

## 2021-08-22 LAB — LACTIC ACID, PLASMA: Lactic Acid, Venous: 1.5 mmol/L (ref 0.5–1.9)

## 2021-08-22 MED ORDER — CLINDAMYCIN HCL 150 MG PO CAPS: 450.0000 mg | ORAL_CAPSULE | Freq: Three times a day (TID) | ORAL | 0 refills | Status: DC

## 2021-08-22 MED ORDER — DOXYCYCLINE HYCLATE 100 MG PO CAPS: 100.0000 mg | ORAL_CAPSULE | Freq: Two times a day (BID) | ORAL | 0 refills | Status: AC

## 2021-08-22 NOTE — Progress Notes (Signed)
Left LE venous duplex study completed. Please see CV Proc for preliminary results.  Anderson Malta   BS, RVT 08/22/2021 3:42 PM

## 2021-08-22 NOTE — Discharge Instructions (Addendum)
Your DVT study was negative today.  I have prescribed a short course of antibiotics to help treat your left leg infection.  Please take 1 tablet twice a day for the next 7 days.  If you experience any fever, chills, worsening symptoms you will need to return to the emergency department.

## 2021-08-22 NOTE — ED Triage Notes (Addendum)
Patient here with redness from his toes to knee in left leg that started Monday, reports history of psoriasis. Patient states he has been wearing his shows now without socks. Patient denies pain, is alert, oriented, ambulatory, and in no apparent distress at this time.

## 2021-08-22 NOTE — ED Provider Notes (Signed)
[facility] EMERGENCY DEPARTMENT Provider Note   CSN: [id number] Arrival date & time: 08/22/21  6270     History  Chief Complaint  Patient presents with   Leg Pain    Justin Gonzales is a 66 y.o. male.  66 y.o male with a PMH of DM, Psoriasis presents to the ED with a chief complaint of left leg swelling for the past 3 days.  Reports he began wearing his shoes without any socks, there was a small wound to his plantar aspect of his foot, now has developed redness and swelling all the way to his left knee.  He reports the pain is worse, swelling has been exacerbated specially with sitting down.  He has not tried any medication for improvement.  Did have a similar presentation in the past where he saw his PCP, was placed on antibiotics for infection.  Blood sugars at home have been within normal limits.  Denies any fever, denies any shortness of breath, denies any chest pain.  The history is provided by the patient and medical records.  Leg Pain Location:  Leg Time since incident:  3 days Injury: no   Leg location:  L leg Pain details:    Quality:  Cramping Associated symptoms: no back pain and no fever        Home Medications Prior to Admission medications   Medication Sig Start Date End Date Taking? Authorizing Provider  doxycycline (VIBRAMYCIN) 100 MG capsule Take 1 capsule (100 mg total) by mouth 2 (two) times daily for 7 days. 08/22/21 08/29/21 Yes , , PA-C  APIDRA SOLOSTAR 100 UNIT/ML Solostar Pen as directed. 03/01/17   [provider]  aspirin EC 81 MG tablet Take 81 mg by mouth daily.    [provider]  atorvastatin (LIPITOR) 80 MG tablet TAKE 1 TABLET BY MOUTH ONCE DAILY AT  6  PM 09/05/20   Josue Hector, MD  furosemide (LASIX) 20 MG tablet Take 1 tablet (20 mg total) by mouth daily. CALL AND SCHEDULE FOLLOW UP OFFICE VISIT TO RECEIVE FURTHER REFILLS. [phone]. 1ST ATTEMPT 08/10/21   Josue Hector, MD  INVOKANA 300  MG TABS tablet Take 1 tablet by mouth daily. 05/07/19   [provider]  losartan (COZAAR) 100 MG tablet Take 1 tablet by mouth once daily 07/06/18   Josue Hector, MD  metoprolol succinate (TOPROL-XL) 50 MG 24 hr tablet TAKE 1 TABLET BY MOUTH ONCE DAILY WITH  OR  IMMEDIATELY  FOLLOWING  A  MEAL.--CALL AND SCHEDULE FOLLOW UP OFFICE VISIT TO RECEIVE FURTHER REFILLS. [phone]. 1ST ATTEMPT 08/10/21   Josue Hector, MD  nitroGLYCERIN (NITROLINGUAL) 0.4 MG/SPRAY spray PLACE 1 SPRAY UNDER THE TONGUE EVERY 5 (FIVE) MINUTES X 3 DOSES AS NEEDED FOR CHEST PAIN (AS DIRECTED) 10/01/20   Josue Hector, MD  pantoprazole (PROTONIX) 40 MG tablet Take 40 mg by mouth daily. 07/22/21   [provider]  sertraline (ZOLOFT) 100 MG tablet Take 100 mg by mouth daily.      [provider]  sitaGLIPtan-metformin (JANUMET) 50-1000 MG per tablet Take 1 tablet by mouth 2 (two) times daily with a meal.      [provider]  TOUJEO SOLOSTAR 300 UNIT/ML SOPN as directed. 04/04/17   [provider]  triamcinolone cream (KENALOG) 0.1 % Apply topically 2 (two) times daily as needed. 07/27/21   [provider]  vitamin E 400 UNIT capsule Take 400 Units by mouth daily.  [provider]      Allergies    Penicillins    Review of Systems   Review of Systems  Constitutional:  Negative for chills and fever.  HENT:  Negative for sore throat.   Respiratory:  Negative for cough and shortness of breath.   Cardiovascular:  Positive for leg swelling. Negative for chest pain.  Gastrointestinal:  Negative for abdominal pain, nausea and vomiting.  Musculoskeletal:  Negative for back pain.  Neurological:  Negative for light-headedness and headaches.  All other systems reviewed and are negative.   Physical Exam Updated Vital Signs BP (!) 184/96   Pulse (!) 101   Temp 97.6 F (36.4 C)   Resp (!) 22   SpO2 96%  Physical Exam Vitals and nursing note reviewed.   Constitutional:      Appearance: Normal appearance. He is obese.  HENT:     Head: Normocephalic and atraumatic.  Cardiovascular:     Rate and Rhythm: Normal rate.     Pulses:          Dorsalis pedis pulses are detected w/ Doppler on the left side.       Posterior tibial pulses are detected w/ Doppler on the left side.  Pulmonary:     Effort: Pulmonary effort is normal.  Abdominal:     General: Abdomen is flat.     Tenderness: There is no abdominal tenderness.  Musculoskeletal:     Cervical back: Normal range of motion and neck supple.     Left lower leg: Swelling and tenderness present. No lacerations. 2+ Pitting Edema present.     Comments: Please see photos attached.   Skin:    General: Skin is warm and dry.     Findings: Erythema present.  Neurological:     Mental Status: He is alert and oriented to person, place, and time.        ED Results / Procedures / Treatments   Labs (all labs ordered are listed, but only abnormal results are displayed) Labs Reviewed  COMPREHENSIVE METABOLIC PANEL - Abnormal; Notable for the following components:      Result Value   Glucose, Bld 159 (*)    All other components within normal limits  CBC WITH DIFFERENTIAL/PLATELET - Abnormal; Notable for the following components:   Platelets 142 (*)    Monocytes Absolute 1.1 (*)    All other components within normal limits  LACTIC ACID, PLASMA  LACTIC ACID, PLASMA    EKG None  Radiology VAS [place] LOWER EXTREMITY VENOUS (DVT) (7a-7p)  Result Date: 08/22/2021  Lower Venous DVT Study Patient Name:  Justin Gonzales  Date of Exam:   08/22/2021 Medical Rec #: [id number]            Accession #:    [id number] Date of Birth: [birth year]            Patient Gender: M Patient Age:   11 years Exam Location:  [facility] Procedure:      VAS [place] LOWER EXTREMITY VENOUS (DVT) Referring Phys: Suella Broad --------------------------------------------------------------------------------  Indications:  Swelling.  Comparison Study: No previous exam noted. Performing Technologist: Bobetta Lime BS, RVT  Examination Guidelines: A complete evaluation includes B-mode imaging, spectral Doppler, color Doppler, and power Doppler as needed of all accessible portions of each vessel. Bilateral testing is considered an integral part of a complete examination. Limited examinations for reoccurring indications may be performed as noted. The reflux portion of the exam is performed with the patient in  reverse Trendelenburg.  +-----+---------------+---------+-----------+----------+--------------+ RIGHTCompressibilityPhasicitySpontaneityPropertiesThrombus Aging +-----+---------------+---------+-----------+----------+--------------+ CFV  Full           Yes      Yes                                 +-----+---------------+---------+-----------+----------+--------------+   +---------+---------------+---------+-----------+----------+--------------+ LEFT     CompressibilityPhasicitySpontaneityPropertiesThrombus Aging +---------+---------------+---------+-----------+----------+--------------+ CFV      Full           Yes      Yes                                 +---------+---------------+---------+-----------+----------+--------------+ SFJ      Full                                                        +---------+---------------+---------+-----------+----------+--------------+ FV Prox  Full                                                        +---------+---------------+---------+-----------+----------+--------------+ FV Mid   Full                                                        +---------+---------------+---------+-----------+----------+--------------+ FV DistalFull                                                        +---------+---------------+---------+-----------+----------+--------------+ PFV      Full                                                         +---------+---------------+---------+-----------+----------+--------------+ POP      Full           Yes      Yes                                 +---------+---------------+---------+-----------+----------+--------------+ PTV      Full                                                        +---------+---------------+---------+-----------+----------+--------------+ PERO     Full                                                        +---------+---------------+---------+-----------+----------+--------------+  Summary: RIGHT: - No evidence of common femoral vein obstruction.  LEFT: - No evidence of deep vein thrombosis in the lower extremity. No indirect evidence of obstruction proximal to the inguinal ligament. - No cystic structure found in the popliteal fossa.  *See table(s) above for measurements and observations.    Preliminary     Procedures Procedures    Medications Ordered in ED Medications - No data to display  ED Course/ Medical Decision Making/ A&P                           Medical Decision Making Amount and/or Complexity of Data Reviewed Labs: ordered.   This patient presents to the ED for concern of leg swelling, this involves a number of treatment options, and is a complaint that carries with it a high risk of complications and morbidity.  The differential diagnosis includes DVT, cellulitis, versus ischemic leg.   Co morbidities: Discussed in HPI   Brief History:  Patient here with left leg swelling for the past 3 days.  Similar episode in the past where he was prescribed antibiotics which improved.  Ongoing history of psoriasis followed by PCP closely, reports he has been wearing his shoes without any socks or for these have been rubbing.  No fevers, no chills.  Blood sugars= controlled at home.  EMR reviewed including pt PMHx, past surgical history and past visits to ER.   See HPI for more details   Lab Tests:  I ordered and independently  interpreted labs.  The pertinent results include:    I personally reviewed all laboratory work and imaging. Metabolic panel without any acute abnormality specifically kidney function within normal limits and no significant electrolyte abnormalities. CBC without leukocytosis or significant anemia. Lactic acid was further obtained to rule out systemic infection, this was negative.   Imaging Studies:  DVT study did not show any acute venous thrombosis at this time  Social Determinants of Health:  The patient's social determinants of health were a factor in the care of this patient   Problem List / ED Course:  Here with left leg swelling for the past 3 days, no fevers, no chills, blood sugars have been within normal limits at home.  Blood work unremarkable without any signs of hemic infection, lactic acid is negative.  DVT study without any signs of acute blood clot.  I have reviewed patient's chart extensively, I do see he has an ongoing history of psoriasis along with venous stasis, I do suspect this is likely worsening.  With significant redness to the area I do believe that patient needs to be treated at this point for cellulitis, will trial 1 week of Doxycline.  Patient was instructed to follow-up closely with PCP in 2 days for reevaluation of the wound. He is neurovascularly intact, has full range of motion of his foot without any decrease in mobility or sensation and good capillary refill.  Ongoing venous stasis history however pulses were detectable via Doppler. I have discussed case with my attending who agrees with plan and management.   Dispostion:  After consideration of the diagnostic results and the patients response to treatment, I feel that the patent would benefit from patient treatment of his cellulitis at this time.  No signs of sepsis.  He is agreeable with outpatient treatment at this time.  Patient stable for discharge.     Portions of this note were generated with Geographical information systems officer. Dictation errors  may occur despite best attempts at proofreading.   Final Clinical Impression(s) / ED Diagnoses Final diagnoses:  Left leg pain  Left leg cellulitis    Rx / DC Orders ED Discharge Orders          Ordered    clindamycin (CLEOCIN) 150 MG capsule  3 times daily,   Status:  Discontinued        08/22/21 1722    doxycycline (VIBRAMYCIN) 100 MG capsule  2 times daily        08/22/21 1727              Janeece Fitting, PA-C 08/22/21 1736    Blanchie Dessert, MD 08/22/21 2325

## 2021-08-27 DIAGNOSIS — L409 Psoriasis, unspecified: Secondary | ICD-10-CM | POA: Diagnosis not present

## 2021-08-27 DIAGNOSIS — Z09 Encounter for follow-up examination after completed treatment for conditions other than malignant neoplasm: Secondary | ICD-10-CM | POA: Diagnosis not present

## 2021-08-27 DIAGNOSIS — L03116 Cellulitis of left lower limb: Secondary | ICD-10-CM | POA: Diagnosis not present

## 2021-08-27 DIAGNOSIS — I872 Venous insufficiency (chronic) (peripheral): Secondary | ICD-10-CM | POA: Diagnosis not present

## 2021-08-31 DIAGNOSIS — I10 Essential (primary) hypertension: Secondary | ICD-10-CM | POA: Diagnosis not present

## 2021-08-31 DIAGNOSIS — I2510 Atherosclerotic heart disease of native coronary artery without angina pectoris: Secondary | ICD-10-CM | POA: Diagnosis not present

## 2021-08-31 DIAGNOSIS — E1165 Type 2 diabetes mellitus with hyperglycemia: Secondary | ICD-10-CM | POA: Diagnosis not present

## 2021-08-31 DIAGNOSIS — E782 Mixed hyperlipidemia: Secondary | ICD-10-CM | POA: Diagnosis not present

## 2021-09-09 ENCOUNTER — Other Ambulatory Visit: Payer: Self-pay | Admitting: Cardiovascular Disease

## 2021-10-14 ENCOUNTER — Ambulatory Visit: Payer: HMO | Admitting: *Deleted

## 2021-10-14 ENCOUNTER — Ambulatory Visit (INDEPENDENT_AMBULATORY_CARE_PROVIDER_SITE_OTHER): Payer: HMO | Admitting: Podiatry

## 2021-10-14 DIAGNOSIS — E0843 Diabetes mellitus due to underlying condition with diabetic autonomic (poly)neuropathy: Secondary | ICD-10-CM

## 2021-10-14 DIAGNOSIS — E119 Type 2 diabetes mellitus without complications: Secondary | ICD-10-CM

## 2021-10-14 DIAGNOSIS — M2041 Other hammer toe(s) (acquired), right foot: Secondary | ICD-10-CM

## 2021-10-14 NOTE — Progress Notes (Signed)
Patient presents to the office today for diabetic shoe and insole measuring.  Patient was measured with brannock device to determine size and width for 1 pair of extra depth shoes and foam casted for 3 pair of insoles.   Documentation of medical necessity will be sent to patient's treating diabetic doctor to verify and sign.   Patient's diabetic provider: Dr. Jacelyn Pi  Shoes and insoles will be ordered at that time and patient will be notified for an appointment for fitting when they arrive.   Shoe size (per patient): 12.5 X-Wide   Brannock measurement: RIGHT - 12 E   LEFT - 12 E  Patient shoe selection-   Shoe choice:   Orthofeet 674  Shoe size ordered: Men's 12 X-Wide   ~Patient requested 12 instead of 13, said the size 13 were too big last year and would rather go a half size down instead of a half size up-shoe chosen was not available in half sizes

## 2021-10-14 NOTE — Progress Notes (Signed)
   Chief Complaint  Patient presents with   Foot Problem    Patient is here interested in diabetic shoes.    HPI: 66 y.o. male PMHx CAD, DM, HTN presenting today for routine diabetic foot exam.  Patient is requesting new diabetic shoes and insoles.  He has no new complaints at this time.  Past Medical History:  Diagnosis Date   Anxiety    Coronary artery disease 2007   Stent to RCA and subsequent CABG    DM (diabetes mellitus) ([place])    HTN (hypertension)    Hypercholesterolemia     Past Surgical History:  Procedure Laterality Date   ARTERIOVENOUS GRAFT PLACEMENT W/ ENDOSCOPIC VEIN HARVEST     Right leg   CORONARY ARTERY BYPASS GRAFT  2007   Median sternotomy, xtracorporeal circulation, coronary artery bypass graft surgery x 4 using a left mamary artery graft to the left anterior descending coronary artery, with a saphenous vein graft to the obtuse marginal branch of the left circumflex coronary artery,   CORONARY ARTERY BYPASS GRAFT  2007   sequential saphenous vein graft to the posterior descending and posterolateral branches of the right coronary artery.      Allergies  Allergen Reactions   Penicillins Other (See Comments)    Unknow, per pt Unknow, per pt     Physical Exam: General: The patient is alert and oriented x3 in no acute distress.  Dermatology: Skin is warm, dry and supple bilateral lower extremities. Negative for open lesions or macerations.  Vascular: Palpable pedal pulses bilaterally. Capillary refill within normal limits.  Negative for any significant edema or erythema  Neurological: Light touch and protective threshold diminished  Musculoskeletal Exam: No pedal deformities noted  Assessment: 1.  Encounter for diabetic foot exam   Plan of Care:  1. Patient evaluated.  Comprehensive diabetic foot exam performed today 2.  Appointment with our diabetic shoe department for fitting for diabetic shoes with insoles 3.  Return to clinic annually      Edrick Kins, DPM [facility]  Dr. Edrick Kins, DPM    [address], [place] [address]                Office [phone]  Fax [phone]

## 2021-10-19 ENCOUNTER — Other Ambulatory Visit: Payer: Self-pay | Admitting: Cardiovascular Disease

## 2021-10-28 DIAGNOSIS — I2510 Atherosclerotic heart disease of native coronary artery without angina pectoris: Secondary | ICD-10-CM | POA: Diagnosis not present

## 2021-10-28 DIAGNOSIS — E782 Mixed hyperlipidemia: Secondary | ICD-10-CM | POA: Diagnosis not present

## 2021-10-28 DIAGNOSIS — I10 Essential (primary) hypertension: Secondary | ICD-10-CM | POA: Diagnosis not present

## 2021-10-28 DIAGNOSIS — E1165 Type 2 diabetes mellitus with hyperglycemia: Secondary | ICD-10-CM | POA: Diagnosis not present

## 2021-11-03 DIAGNOSIS — H35372 Puckering of macula, left eye: Secondary | ICD-10-CM | POA: Diagnosis not present

## 2021-11-03 DIAGNOSIS — H35033 Hypertensive retinopathy, bilateral: Secondary | ICD-10-CM | POA: Diagnosis not present

## 2021-11-03 DIAGNOSIS — E113413 Type 2 diabetes mellitus with severe nonproliferative diabetic retinopathy with macular edema, bilateral: Secondary | ICD-10-CM | POA: Diagnosis not present

## 2021-11-03 DIAGNOSIS — H43823 Vitreomacular adhesion, bilateral: Secondary | ICD-10-CM | POA: Diagnosis not present

## 2021-11-09 NOTE — Progress Notes (Unsigned)
Patient ID: Justin Gonzales, male   DOB: 10-08-1955, 66 y.o.   MRN: [id number]     66 y.o. CAD stent RCA then CABG 2007  On insulin for DM. Does work out at [employer] No angina. 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  All other systems reviewed and negative  General:  BP (!) 138/92   Pulse 80   Ht '6\' 1"'$  (1.854 m)   Wt (!) 340 lb (154.2 kg)   SpO2 96%   BMI 44.86 kg/m   Affect appropriate Overweight white male  HEENT: normal Neck supple with no adenopathy JVP normal no bruits no thyromegaly Lungs clear with no wheezing and good diaphragmatic motion Heart:  S1/S2 no murmur, no rub, gallop or click PMI normal Abdomen: benighn, BS positve, no tenderness, no AAA no bruit.  No HSM or HJR Distal pulses intact with no bruits Plus 2 bilateral  edema with echymotic discoloration  Neuro non-focal Skin psoriasis on back arms and legs  No muscular weakness   Allergies  Penicillins  Electrocardiogram:   11/10/2021  SR rate 80 ? Old IMI IVCD lateral T wave changes    Assessment and Plan CAD/CABG:  2007 : Compliant with meds Myovue 09/24/20 inferior infarct EF 40% similar to 2014 Discussed changing to entresto from losartan with EF 40% continue lasix and Toprol   Psoriasis:   Improved with green tea extract f/u dermatology Rx cellulitis 08/22/21 left leg   HTN:  Home readings fine some white coat component continue current meds weight loss important   DM: Discussed low carb diet.  Target hemoglobin A1c is 6.5 or less.  Continue current medications.  Chol:  On statin labs with primary   Edema: dependant from obesity and venous reflux Lasix 20 mg called in encouraged compliance   D/C losartan Start Entresto low dose BMET /BNP F/U Pharm D 4 weeks to titrate   F/U with me in 6 months    F/U in a year if low risk   [name]

## 2021-11-10 ENCOUNTER — Encounter: Payer: Self-pay | Admitting: Cardiovascular Disease

## 2021-11-10 ENCOUNTER — Ambulatory Visit: Payer: HMO | Attending: Cardiovascular Disease | Admitting: Cardiovascular Disease

## 2021-11-10 VITALS — BP 138/92 | HR 80 | Ht 73.0 in | Wt 340.0 lb

## 2021-11-10 DIAGNOSIS — R609 Edema, unspecified: Secondary | ICD-10-CM

## 2021-11-10 DIAGNOSIS — Z951 Presence of aortocoronary bypass graft: Secondary | ICD-10-CM | POA: Diagnosis not present

## 2021-11-10 DIAGNOSIS — I255 Ischemic cardiomyopathy: Secondary | ICD-10-CM

## 2021-11-10 DIAGNOSIS — I10 Essential (primary) hypertension: Secondary | ICD-10-CM

## 2021-11-10 MED ORDER — ENTRESTO 49-51 MG PO TABS: 1.0000 | ORAL_TABLET | Freq: Two times a day (BID) | ORAL | 11 refills | Status: DC

## 2021-11-10 NOTE — Addendum Note (Signed)
Addended by: Aris Georgia,  L on: 11/10/2021 10:09 AM   Modules accepted: Orders

## 2021-11-10 NOTE — Patient Instructions (Addendum)
Medication Instructions:  Your physician has recommended you make the following change in your medication:  1-STOP Losartan (Cozaar)  2-START Entresto 49/51 mg by mouth daily twice daily.         *If you need a refill on your cardiac medications before your next appointment, please call your pharmacy*  Lab Work: Your physician recommends that you have lab work in 4 weeks BMET and BNP  If you have labs (blood work) drawn today and your tests are completely normal, you will receive your results only by: [place] (if you have [place]) OR A paper copy in the mail If you have any lab test that is abnormal or we need to change your treatment, we will call you to review the results.  Testing/Procedures: None ordered today.   Follow-Up: At [facility], you and your health needs are our priority.  As part of our continuing mission to provide you with exceptional heart care, we have created designated Provider Care Teams.  These Care Teams include your primary Cardiologist (physician) and Advanced Practice Providers (APPs -  Physician Assistants and Nurse Practitioners) who all work together to provide you with the care you need, when you need it.  We recommend signing up for the patient portal called "MyChart".  Sign up information is provided on this After Visit Summary.  MyChart is used to connect with patients for Virtual Visits (Telemedicine).  Patients are able to view lab/test results, encounter notes, upcoming appointments, etc.  Non-urgent messages can be sent to your provider as well.   To learn more about what you can do with MyChart, go to [email or web address].    Your next appointment:   6 month(s)  The format for your next appointment:   In Person  Provider:   [name], MD     Other Instructions You have been referred to pharmacist to titrate Entresto in 4 weeks   Important Information About Sugar

## 2021-11-11 ENCOUNTER — Encounter: Payer: Self-pay | Admitting: Cardiovascular Disease

## 2021-11-12 DIAGNOSIS — L400 Psoriasis vulgaris: Secondary | ICD-10-CM | POA: Diagnosis not present

## 2021-11-26 ENCOUNTER — Ambulatory Visit: Payer: HMO | Admitting: Cardiovascular Disease

## 2021-12-03 ENCOUNTER — Other Ambulatory Visit: Payer: Self-pay | Admitting: Cardiovascular Disease

## 2021-12-04 ENCOUNTER — Other Ambulatory Visit: Payer: Self-pay | Admitting: Cardiovascular Disease

## 2021-12-04 ENCOUNTER — Ambulatory Visit: Payer: HMO | Attending: Cardiovascular Disease

## 2021-12-04 DIAGNOSIS — I10 Essential (primary) hypertension: Secondary | ICD-10-CM

## 2021-12-04 DIAGNOSIS — Z951 Presence of aortocoronary bypass graft: Secondary | ICD-10-CM

## 2021-12-04 DIAGNOSIS — R609 Edema, unspecified: Secondary | ICD-10-CM

## 2021-12-05 LAB — BASIC METABOLIC PANEL
BUN/Creatinine Ratio: 18 (ref 10–24)
BUN: 15 mg/dL (ref 8–27)
CO2: 25 mmol/L (ref 20–29)
Calcium: 9.7 mg/dL (ref 8.6–10.2)
Chloride: 99 mmol/L (ref 96–106)
Creatinine, Ser: 0.85 mg/dL (ref 0.76–1.27)
Glucose: 126 mg/dL — ABNORMAL HIGH (ref 70–99)
Potassium: 4.5 mmol/L (ref 3.5–5.2)
Sodium: 139 mmol/L (ref 134–144)
eGFR: 96 mL/min/{1.73_m2} (ref >59)

## 2021-12-05 LAB — PRO B NATRIURETIC PEPTIDE: NT-Pro BNP: 471 pg/mL — ABNORMAL HIGH (ref 0–376)

## 2021-12-08 ENCOUNTER — Encounter: Payer: Self-pay | Admitting: Physician Assistant

## 2021-12-08 ENCOUNTER — Ambulatory Visit: Payer: HMO | Attending: Physician Assistant | Admitting: Nurse Practitioner

## 2021-12-08 VITALS — BP 130/70 | HR 88 | Ht 73.0 in | Wt 338.0 lb

## 2021-12-08 DIAGNOSIS — I255 Ischemic cardiomyopathy: Secondary | ICD-10-CM | POA: Diagnosis not present

## 2021-12-08 DIAGNOSIS — I5020 Unspecified systolic (congestive) heart failure: Secondary | ICD-10-CM

## 2021-12-08 DIAGNOSIS — E118 Type 2 diabetes mellitus with unspecified complications: Secondary | ICD-10-CM

## 2021-12-08 DIAGNOSIS — I10 Essential (primary) hypertension: Secondary | ICD-10-CM

## 2021-12-08 DIAGNOSIS — I2510 Atherosclerotic heart disease of native coronary artery without angina pectoris: Secondary | ICD-10-CM | POA: Diagnosis not present

## 2021-12-08 DIAGNOSIS — E7800 Pure hypercholesterolemia, unspecified: Secondary | ICD-10-CM

## 2021-12-08 DIAGNOSIS — R609 Edema, unspecified: Secondary | ICD-10-CM

## 2021-12-08 DIAGNOSIS — Z794 Long term (current) use of insulin: Secondary | ICD-10-CM

## 2021-12-08 MED ORDER — SACUBITRIL-VALSARTAN 97-103 MG PO TABS: 1.0000 | ORAL_TABLET | Freq: Two times a day (BID) | ORAL | 3 refills | Status: DC

## 2021-12-08 NOTE — Progress Notes (Addendum)
Cardiology Office Note:    Date:  12/08/2021   ID:  Justin Gonzales, DOB 1955/08/27, MRN [id number]  PCP:  Merrilee Seashore, [place] Prov[facility] Cardiologist:  [name], MD     Referring MD: Merrilee Seashore, MD   Chief complaint: Here for CHF follow-up  History of Present Illness:    Justin Gonzales is a 66 y.o. male with a hx of the following:  CAD, s/p DES to RCA, s/p CABG in 2007 T2DM PTSD Anxiety Hypertension Hypercholesterolemia Edema  Last Myoview in 2022 revealed mostly inferior infarct, EF 40%.  Last seen by Dr. Porfirio Mylar in on November 10, 2021.  Patient noted weight gain, more edema, and dyspnea he was found to not be taking Lasix, just using parsley tea.  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  Got disability for PTSD.  Losartan discontinued and low-dose Entresto initiated.  Was told to follow-up in 4 weeks to titrate Entresto.  Today he presents for follow-up.  He states he is doing well since starting Entresto.  Tolerating medication well.  BP well controlled at home.  Does state he is needing assistance with cost, was previously given 1 month free supply, and states he has enough medication.  Denies any chest pain, shortness of breath, orthopnea, PND, palpitations, dizziness, syncope, presyncope, worsening swelling or significant weight changes, bleeding, or claudication. Denies any other questions or concerns.   Past Medical History:  Diagnosis Date   Anxiety    Coronary artery disease 02/01/2005   Stent to RCA and subsequent CABG    DM (diabetes mellitus) ([place])    HFrEF (heart failure with reduced ejection fraction) ([name])    Nuclear stress EF 40%   HTN (hypertension)    Hypercholesterolemia    Morbid obesity ([place])     Past Surgical History:  Procedure Laterality Date   ARTERIOVENOUS GRAFT PLACEMENT W/ ENDOSCOPIC VEIN HARVEST     Right leg   CORONARY ARTERY  BYPASS GRAFT  2007   Median sternotomy, xtracorporeal circulation, coronary artery bypass graft surgery x 4 using a left mamary artery graft to the left anterior descending coronary artery, with a saphenous vein graft to the obtuse marginal branch of the left circumflex coronary artery,   CORONARY ARTERY BYPASS GRAFT  2007   sequential saphenous vein graft to the posterior descending and posterolateral branches of the right coronary artery.      Current Medications: Current Meds  Medication Sig   APIDRA SOLOSTAR 100 UNIT/ML Solostar Pen 14 Units in the morning and at bedtime.   aspirin EC 81 MG tablet Take 81 mg by mouth daily.   atorvastatin (LIPITOR) 80 MG tablet TAKE 1 TABLET BY MOUTH ONCE DAILY IN THE EVENING . APPOINTMENT REQUIRED FOR FUTURE REFILLS   furosemide (LASIX) 20 MG tablet TAKE 1 TABLET BY MOUTH ONCE DAILY . APPOINTMENT REQUIRED FOR FUTURE REFILLS   INVOKANA 300 MG TABS tablet Take 1 tablet by mouth daily.   metoprolol succinate (TOPROL-XL) 50 MG 24 hr tablet TAKE 1 TABLET BY MOUTH ONCE DAILY . APPOINTMENT REQUIRED FOR FUTURE REFILLS   nitroGLYCERIN (NITROLINGUAL) 0.4 MG/SPRAY spray PLACE ONE SPRAY UNDER THE TONGUE EVERY 5 MINUTES FOR 3 DOSES, AS NEEDED FOR CHEST PAIN. (AS DIRECTED)   pantoprazole (PROTONIX) 40 MG tablet Take 40 mg by mouth daily.   sacubitril-valsartan (ENTRESTO) 97-103 MG Take 1 tablet by mouth 2 (two) times daily.   sertraline (ZOLOFT) 100 MG tablet  Take 100 mg by mouth daily.     sitaGLIPtan-metformin (JANUMET) 50-1000 MG per tablet Take 1 tablet by mouth 2 (two) times daily with a meal.     TOUJEO SOLOSTAR 300 UNIT/ML SOPN Inject 80 Units into the skin in the morning and at bedtime.   triamcinolone cream (KENALOG) 0.1 % Apply topically 2 (two) times daily as needed.   vitamin E 400 UNIT capsule Take 400 Units by mouth daily.   [DISCONTINUED] sacubitril-valsartan (ENTRESTO) 49-51 MG Take 1 tablet by mouth 2 (two) times daily.     Allergies:   Penicillin g  and Penicillins   Social History   Socioeconomic History   Marital status: Married    Spouse name: Not on file   Number of children: Not on file   Years of education: Not on file   Highest education level: Not on file  Occupational History   Occupation: [employer]    Comment: factory work  Tobacco Use   Smoking status: Never   Smokeless tobacco: Never  Scientific laboratory technician Use: Never used  Substance and Sexual Activity   Alcohol use: Yes    Comment: occasional   Drug use: No   Sexual activity: Not on file  Other Topics Concern   Not on file  Social History Narrative   Lives in [place], [place].     Social Determinants of Health   Financial Resource Strain: Not on file  Food Insecurity: Not on file  Transportation Needs: Not on file  Physical Activity: Not on file  Stress: Not on file  Social Connections: Not on file     Family History: The patient's family history includes Cancer in his father; Diabetes in his mother; Hypertension in his mother.  ROS:   Review of Systems  Constitutional: Negative.   HENT: Negative.    Eyes: Negative.   Respiratory: Negative.    Cardiovascular:  Positive for leg swelling. Negative for chest pain, palpitations, orthopnea, claudication and PND.       Improved.  See HPI.  Gastrointestinal: Negative.   Genitourinary: Negative.   Musculoskeletal: Negative.   Skin:  Positive for itching and rash.       History of psoriasis.  See HPI.  Neurological: Negative.   Endo/Heme/Allergies: Negative.   Psychiatric/Behavioral: Negative.      Please see the history of present illness.    All other systems reviewed and are negative.  EKGs/Labs/Other Studies Reviewed:    The following studies were reviewed today:   EKG:  EKG is not ordered today.  Vascular ultrasound lower extremity Doppler on August 22, 2021: RIGHT:  - No evidence of common femoral vein obstruction.    LEFT:  - No evidence of deep vein thrombosis in  the lower extremity. No indirect  evidence of obstruction proximal to the inguinal ligament.  - No cystic structure found in the popliteal fossa.   Lexiscan on September 25, 2020:   Findings are consistent with prior myocardial infarction with peri-infarct ischemia. The study is intermediate risk.   No ST deviation was noted.   LV perfusion is abnormal. There is no evidence of ischemia. There is evidence of infarction.   Defect 1: There is a medium defect with moderate reduction in uptake present in the basal inferior and inferolateral location(s) that is partially reversible. There is abnormal wall motion in the defect area. Consistent with infarction and peri-infarct ischemia.   Nuclear stress EF: 40 %. The left ventricular ejection fraction is moderately  decreased (30-44%). Left ventricular function is abnormal. Global function is moderately reduced. End diastolic cavity size is mildly enlarged.   Intermediate risk stress nuclear study with basal inferolateral infarction with limited peri-infarct ischemia. Mild-to-moderate reduction in left ventricular global systolic function.  Recent Labs: 08/22/2021: ALT 26; Hemoglobin 14.8; Platelets 142 12/04/2021: BUN 15; Creatinine, Ser 0.85; NT-Pro BNP 471; Potassium 4.5; Sodium 139  Recent Lipid Panel No results found for: "CHOL", "TRIG", "HDL", "CHOLHDL", "VLDL", "LDLCALC", "LDLDIRECT"      Physical Exam:    VS:  BP 130/70   Pulse 88   Ht '6\' 1"'$  (1.854 m)   Wt (!) 338 lb (153.3 kg)   SpO2 93%   BMI 44.59 kg/m     Wt Readings from Last 3 Encounters:  12/08/21 (!) 338 lb (153.3 kg)  11/10/21 (!) 340 lb (154.2 kg)  09/24/20 (!) 334 lb (151.5 kg)     GEN: Morbidly obese, 66 year old male in no acute distress HEENT: Normal NECK: No JVD; No carotid bruits CARDIAC: S1/S2, RRR, no murmurs, rubs, gallops; 2+ peripheral pulses along the radials, strong and equal bilaterally, 1+ peripheral pulses along posterior tibials, equal  bilaterally RESPIRATORY:  Clear and diminished to auscultation without rales, wheezing or rhonchi  MUSCULOSKELETAL: Nonpitting edema along BLE; No deformity  SKIN: Scattered areas of plaque psoriasis along bilateral arms, skin discoloration along BLE, otherwise warm and dry NEUROLOGIC:  Alert and oriented x 3 PSYCHIATRIC:  Normal, pleasant affect   ASSESSMENT:    1. Coronary artery disease involving native heart without angina pectoris, unspecified vessel or lesion type   2. HFrEF (heart failure with reduced ejection fraction) ([place])   3. Ischemic cardiomyopathy   4. Hypertension, unspecified type   5. HYPERCHOLESTEROLEMIA   6. Peripheral edema   7. Type 2 diabetes mellitus with complication, with long-term current use of insulin ([place])   8. Morbid obesity ([place])    PLAN:    In order of problems listed above:  Coronary artery disease, status post DES to RCA, s/p CABG in 2007 Myoview in 2022 showed old MI similar to previous Myoview, medical therapy was recommended.  Nuclear stress EF 40%. Stable with no anginal symptoms. No indication for ischemic evaluation.  Continue aspirin, Lipitor, Toprol-XL, and nitroglycerin as needed.  We will refill nitroglycerin today. Heart healthy diet and regular cardiovascular exercise encouraged.   Heart failure with mildly reduced EF, ischemic CM Euvolemic and well compensated on exam. Nuclear stress EF 40%, Myoview revealed old MI.  Etiology most likely ischemic cardiomyopathy.  Tolerating Entresto well, will increase Entresto to 97/103 mg BID and repeat BMET in 2 weeks.  We will give patient paperwork for patient assistance with financial assistance regarding Entresto.  Continue aspirin, Lasix, Invokana, and Toprol-XL. Low sodium diet, fluid restriction <2L, and daily weights encouraged. Educated to contact our office for weight gain of 2 lbs overnight or 5 lbs in one week. Heart healthy diet and regular cardiovascular exercise encouraged.  Consider updating  TTE within 3 months once on max dose GDMT.  Hypertension BP today 130/70.  BP well controlled at home.  Does admit to whitecoat hypertension.  We will increase Entresto as mentioned above.  Continue Toprol-XL. Heart healthy diet and regular cardiovascular exercise encouraged.   Hypercholesterolemia HDL and total cholesterol from August 2022 was 44 and 174 respectively.  LDL from April 2022 was 77 and triglycerides were 254 from April 2023.  We will obtain fasting lipid panel and LFT at next OV.  Continue Lipitor. Heart  healthy diet and regular cardiovascular exercise encouraged.   Bilateral lower extremity edema This has improved from previous.  Discussed low-salt diet, wearing compression stockings, and leg elevation.  It appears that he has chronic venous insufficiency.  Continue Lasix. Heart healthy diet and regular cardiovascular exercise encouraged.   Type 2 diabetes Last A1c 7.1% in September 2023.  This is being managed by PCP.  Continue Invokana, Toujeo, insulin glulisine, and Janumet. Heart healthy diet and regular cardiovascular exercise encouraged.   Morbid obesity BMI today 44.59.  He is down 2 pounds from 1 month ago. Weight loss via diet and exercise encouraged. Discussed the impact being overweight would have on cardiovascular risk.    8.  Disposition: Follow-up with Dr. Johnsie Cancel or APP in 4 to 6 weeks or sooner if anything changes.    Medication Adjustments/Labs and Tests Ordered: Current medicines are reviewed at length with the patient today.  Concerns regarding medicines are outlined above.  Orders Placed This Encounter  Procedures   Basic metabolic panel   Lipid Profile   Hepatic function panel   Meds ordered this encounter  Medications   sacubitril-valsartan (ENTRESTO) 97-103 MG    Sig: Take 1 tablet by mouth 2 (two) times daily.    Dispense:  180 tablet    Refill:  3    Patient Instructions  Medication Instructions:  Your physician has recommended you make  the following change in your medication:   INCREASE Entresto to 49/51 taking 1 twice a day   *If you need a refill on your cardiac medications before your next appointment, please call your pharmacy*   Lab Work: 2 WEEKS:  BMET  If you have labs (blood work) drawn today and your tests are completely normal, you will receive your results only by: [place] (if you have MyChart) OR A paper copy in the mail If you have any lab test that is abnormal or we need to change your treatment, we will call you to review the results.   Testing/Procedures: None ordered   Follow-Up: At [facility], you and your health needs are our priority.  As part of our continuing mission to provide you with exceptional heart care, we have created designated Provider Care Teams.  These Care Teams include your primary Cardiologist (physician) and Advanced Practice Providers (APPs -  Physician Assistants and Nurse Practitioners) who all work together to provide you with the care you need, when you need it.  We recommend signing up for the patient portal called "MyChart".  Sign up information is provided on this After Visit Summary.  MyChart is used to connect with patients for Virtual Visits (Telemedicine).  Patients are able to view lab/test results, encounter notes, upcoming appointments, etc.  Non-urgent messages can be sent to your provider as well.   To learn more about what you can do with MyChart, go to [email or web address].    Your next appointment:   4 week(s)  The format for your next appointment:   In Person  Provider:   [name], MD  or Richardson Dopp, PA-C         Other Instructions   Important Information About Sugar         Signed, [name], NP  12/08/2021 12:56 PM    [place]

## 2021-12-08 NOTE — Patient Instructions (Signed)
Medication Instructions:  Your physician has recommended you make the following change in your medication:   INCREASE Entresto to 49/51 taking 1 twice a day   *If you need a refill on your cardiac medications before your next appointment, please call your pharmacy*   Lab Work: 2 WEEKS:  BMET  If you have labs (blood work) drawn today and your tests are completely normal, you will receive your results only by: [place] (if you have MyChart) OR A paper copy in the mail If you have any lab test that is abnormal or we need to change your treatment, we will call you to review the results.   Testing/Procedures: None ordered   Follow-Up: At [facility], you and your health needs are our priority.  As part of our continuing mission to provide you with exceptional heart care, we have created designated Provider Care Teams.  These Care Teams include your primary Cardiologist (physician) and Advanced Practice Providers (APPs -  Physician Assistants and Nurse Practitioners) who all work together to provide you with the care you need, when you need it.  We recommend signing up for the patient portal called "MyChart".  Sign up information is provided on this After Visit Summary.  MyChart is used to connect with patients for Virtual Visits (Telemedicine).  Patients are able to view lab/test results, encounter notes, upcoming appointments, etc.  Non-urgent messages can be sent to your provider as well.   To learn more about what you can do with MyChart, go to [email or web address].    Your next appointment:   4 week(s)  The format for your next appointment:   In Person  Provider:   [name], MD  or Richardson Dopp, PA-C         Other Instructions   Important Information About Sugar

## 2021-12-09 ENCOUNTER — Telehealth: Payer: Self-pay

## 2021-12-09 MED ORDER — FUROSEMIDE 20 MG PO TABS: 20.0000 mg | ORAL_TABLET | Freq: Every day | ORAL | 3 refills | Status: DC

## 2021-12-09 MED ORDER — METOPROLOL SUCCINATE ER 50 MG PO TB24: 50.0000 mg | ORAL_TABLET | Freq: Every day | ORAL | 3 refills | Status: DC

## 2021-12-09 NOTE — Telephone Encounter (Signed)
-----   Message from Josue Hector, MD sent at 12/07/2021  7:40 AM EST ----- Labs ok BNP minimally elevated continue current diuretics

## 2021-12-09 NOTE — Telephone Encounter (Signed)
**Note De-Identified  Obfuscation** The pts completed [employer] PA[employer] application for Entresto assistance was left at the office with documents.  I have completed the providers page of his application and have e-mailed all to Dr Kyla Balzarine nurse so she can obtain his signature, date it, and to then fax all to [employer] at the fax number written on the cover letter included.

## 2021-12-10 NOTE — Telephone Encounter (Signed)
Patient aware of lab work. Patient's PAF was signed and faxed to [employer]. We received confirmation that the fax was sent.

## 2021-12-17 ENCOUNTER — Other Ambulatory Visit: Payer: Self-pay | Admitting: Cardiovascular Disease

## 2021-12-22 ENCOUNTER — Ambulatory Visit: Payer: HMO | Attending: Nurse Practitioner

## 2021-12-22 DIAGNOSIS — I255 Ischemic cardiomyopathy: Secondary | ICD-10-CM

## 2021-12-22 NOTE — Telephone Encounter (Signed)
**Note De-Identified  Obfuscation** Letter received from [employer] PA[employer] stating that they have approved the pt for Entresto assistance until 02/01/2023. Patient ID: [id number]  The letter states that they have notified the pt of this approval as well.

## 2021-12-23 LAB — BASIC METABOLIC PANEL
BUN/Creatinine Ratio: 17 (ref 10–24)
BUN: 18 mg/dL (ref 8–27)
CO2: 26 mmol/L (ref 20–29)
Calcium: 9.8 mg/dL (ref 8.6–10.2)
Chloride: 101 mmol/L (ref 96–106)
Creatinine, Ser: 1.04 mg/dL (ref 0.76–1.27)
Glucose: 76 mg/dL (ref 70–99)
Potassium: 4.4 mmol/L (ref 3.5–5.2)
Sodium: 142 mmol/L (ref 134–144)
eGFR: 80 mL/min/{1.73_m2} (ref >59)

## 2021-12-29 ENCOUNTER — Ambulatory Visit: Payer: HMO | Attending: Cardiovascular Disease | Admitting: Pharmacist

## 2021-12-29 VITALS — BP 136/74

## 2021-12-29 DIAGNOSIS — I5020 Unspecified systolic (congestive) heart failure: Secondary | ICD-10-CM

## 2021-12-29 MED ORDER — SPIRONOLACTONE 25 MG PO TABS: 12.5000 mg | ORAL_TABLET | Freq: Every day | ORAL | 3 refills | Status: DC

## 2021-12-29 NOTE — Assessment & Plan Note (Addendum)
Assessment: Tolerating dose increase of Entresto to 97/103 mg BID with BMET within normal limits  Blood pressure in office was a slightly above goal with reading of 138/74, has white coat hypertension; HR 77 At home readings have been within goal, but no readings were provided today, sent electronically to PCP/insurance No signs or symptoms of low blood pressure (dizziness, fatigue) or high blood pressure (headaches) Did not assess diet, but patient currently trying to lose weight Physical activity limited to back pain No recent complaints of shortness of breath and edema is well controlled with lasix and compression stockings Only had one Entresto tablet left for this evening  Plan: Start spironolactone 12.5 mg daily in addition to other GDMT for heart failure and blood pressure control Repeat labs on 01/12/2022 Follow up in person visit on 02/10/2022 at 10:30AM Continue to monitor blood pressure at home Continue lifestyle management to help lose weight F/u further titration of spironolactone and metoprolol for heart failure  Use Entresto 49/51 mg: two tablets by mouth BID until he can get refills

## 2021-12-29 NOTE — Patient Instructions (Addendum)
Start taking spironolactone 12.5 mg daily  Follow up lab work in 2 weeks (01/12/2022- can come in at any time for lab) Follow up in person appointment in 6 weeks (02/10/2022 10:30AM) Try to increase physical activity to what able to do and watch diet Use proper technique when measuring blood pressure:

## 2021-12-29 NOTE — Progress Notes (Addendum)
Patient ID: Justin Gonzales                 DOB: Jun 22, 1955                      MRN: [id number]      HPI: Justin Gonzales is a 66 y.o. male referred by Dr. Johnsie Cancel to HTN clinic. PMH is significant for CAD stent RCA (CABG 2007), ID-T2DM, HTN, HLD, HFrEF (EF 40%), and obesity. Patient previously met with Dr. Johnsie Cancel in October where it was noted that he had more weight gain, edema, and shortness of breath. He was found to not be taking his lasix and just using parsley tea. Patient noticed his shortness of breath when helping his wife vacuum and do things around the house. His losartan was discontinued and he started low dose Entresto.  He followed up with [name], NP in November and stated tolerating the [facility] well with controlled blood pressures at home. At this appointment Entresto was titrated up to 97/103 mg BID. On 11/08, patient recently approved for Entresto assistance until 02/01/2023. His Most recent BMET on 11/21 after Entresto titration showed all electrolytes WNL, Cr at 1.04 from 0.85 3 weeks ago, but was at 1.09 in July.  Patient presents today after increase in Entresto to 97/103 mg BID for management of heart failure and blood pressure. Patient has stated his home blood pressures have been within goal and his readings are electronically sent to PCP/insurance. Patient tends to have an elevated blood pressure in the office, but at home when he rests before taking his blood pressure he is within goal of <130/80. He did not have any readings at this time, but stated he is usually running within the 301S systolically and 01U diastolically. He is not able to increase physical activity due to pain in back and is on disability. Tried to discuss his diet and patient states he has been trying to lose weight, but unable to know if is making changes with salt intake. Has had no signs or symptoms of fatigue, dizziness or headaches,has had no issues with increase of Entresto, and adherent to  all his medications. He also hasn't had shortness of breath and wears compression stockings at home. Patient had questions about refills for [facility] and educated on using the phone number to call drug company to get medication refills. Patient was frustrated with costs of medications and how his monthly check is put towards all his medications, but thankful for the assistance he has been getting. He only had one Entresto pill left and received samples of the Entresto 49/51 mg tablets and instructed to take 2 tablets two times daily. Patient is open to adding to regimen to improve his heart failure and is willing to try spironolactone and prescription sent to pharmacy  Current GDMT meds: Entresto 97-103 mg two times daily, metoprolol succ. 50 mg daily, invokana 300 mg daily  Previously tried: losartan 100 mg daily  BP goal: <130/80  Family History:  Family History  Problem Relation Age of Onset   Cancer Father    Hypertension Mother    Diabetes Mother      Social History:  reports that he has never smoked. He has never used smokeless tobacco. He reports current alcohol use. He reports that he does not use drugs.   Diet: N/A  Exercise: has pain in back that needs surgery limiting his physical activity  Home BP readings: within range   Office BP readings:  Manual: 134/78, 136/7 HR 77  Wt Readings from Last 3 Encounters:  12/08/21 (!) 338 lb (153.3 kg)  11/10/21 (!) 340 lb (154.2 kg)  09/24/20 (!) 334 lb (151.5 kg)   BP Readings from Last 3 Encounters:  12/29/21 136/74  12/08/21 130/70  11/10/21 (!) 138/92   Pulse Readings from Last 3 Encounters:  12/08/21 88  11/10/21 80  08/22/21 78    Renal function: CrCl cannot be calculated (Unknown ideal weight.).  Past Medical History:  Diagnosis Date   Anxiety    Coronary artery disease 02/01/2005   Stent to RCA and subsequent CABG    DM (diabetes mellitus) ([place])    HFrEF (heart failure with reduced ejection fraction) ([place])     Nuclear stress EF 40%   HTN (hypertension)    Hypercholesterolemia    Morbid obesity ([place])     Current Outpatient Medications on File Prior to Visit  Medication Sig Dispense Refill   APIDRA SOLOSTAR 100 UNIT/ML Solostar Pen 14 Units in the morning and at bedtime.     aspirin EC 81 MG tablet Take 81 mg by mouth daily.     atorvastatin (LIPITOR) 80 MG tablet Take 1 tablet by mouth in the evening 90 tablet 3   furosemide (LASIX) 20 MG tablet Take 1 tablet (20 mg total) by mouth daily. 90 tablet 3   INVOKANA 300 MG TABS tablet Take 1 tablet by mouth daily.     metoprolol succinate (TOPROL-XL) 50 MG 24 hr tablet Take 1 tablet (50 mg total) by mouth daily. Take with or immediately following a meal. 90 tablet 3   nitroGLYCERIN (NITROLINGUAL) 0.4 MG/SPRAY spray PLACE ONE SPRAY UNDER THE TONGUE EVERY 5 MINUTES FOR 3 DOSES, AS NEEDED FOR CHEST PAIN. (AS DIRECTED) 12 g 3   pantoprazole (PROTONIX) 40 MG tablet Take 40 mg by mouth daily.     sacubitril-valsartan (ENTRESTO) 97-103 MG Take 1 tablet by mouth 2 (two) times daily. 180 tablet 3   sertraline (ZOLOFT) 100 MG tablet Take 100 mg by mouth daily.       sitaGLIPtan-metformin (JANUMET) 50-1000 MG per tablet Take 1 tablet by mouth 2 (two) times daily with a meal.       TOUJEO SOLOSTAR 300 UNIT/ML SOPN Inject 80 Units into the skin in the morning and at bedtime.     triamcinolone cream (KENALOG) 0.1 % Apply topically 2 (two) times daily as needed.     vitamin E 400 UNIT capsule Take 400 Units by mouth daily.     No current facility-administered medications on file prior to visit.    Allergies  Allergen Reactions   Penicillin G     Other Reaction(s): rash,ok with cephalosporins   Penicillins Other (See Comments)    Unknow, per pt Unknow, per pt   Blood pressure 136/74.  Assessment/Plan:    HFmrEF (heart failure with mildly reduced ejection fraction) (HCC) Assessment: Tolerating dose increase of Entresto to 97/103 mg BID with BMET within  normal limits  Blood pressure in office was a slightly above goal with reading of 138/74, has white coat hypertension; HR 77 At home readings have been within goal, but no readings were provided today, sent electronically to PCP/insurance No signs or symptoms of low blood pressure (dizziness, fatigue) or high blood pressure (headaches) Did not assess diet, but patient currently trying to lose weight Physical activity limited to back pain No recent complaints of shortness of breath and edema is well controlled with lasix and compression stockings Only  had one Entresto tablet left for this evening  Plan: Start spironolactone 12.5 mg daily in addition to other GDMT for heart failure and blood pressure control Repeat labs on 01/12/2022 Follow up in person visit on 02/10/2022 at 10:30AM Continue to monitor blood pressure at home Continue lifestyle management to help lose weight F/u further titration of spironolactone and metoprolol for heart failure  Use Entresto 49/51 mg: two tablets by mouth BID until he can get refills  Thank you,  Sandford Craze, PharmD. [facility] Acute [facility] PGY-1 12/29/2021 11:10 AM   Ramond Dial, Pharm.D, BCPS, CPP [facility] of [facility] [address] [place], [place] [address]  Phone: [phone]; Fax: [phone]

## 2022-01-04 ENCOUNTER — Other Ambulatory Visit: Payer: Self-pay | Admitting: Cardiovascular Disease

## 2022-01-05 NOTE — Progress Notes (Unsigned)
Cardiology Office Note    Date:  01/06/2022   ID:  Justin Gonzales, Schwarz [birth year], MRN [id number]  PCP:  Merrilee Seashore, MD  Cardiologist:  [name], MD  Electrophysiologist:  None   Chief Complaint: follow up for management of HF, HTN  History of Present Illness:   Justin Gonzales is a 66 y.o. male with history of  CAD stent RCA (CABG 2007), ID-T2DM, HTN, HLD, HFrEF (EF 40%), PTSD (disabled), and obesity.   Patient previously met with Dr. Johnsie Cancel in October where it was noted that he had more weight gain, edema, and shortness of breath. He was found to not be taking his lasix and just using parsley tea. Patient noticed his shortness of breath when helping his wife vacuum and do things around the house. His losartan was discontinued and he started low dose Entresto. He was referred to the HTN clinic, has been added on all GDMT related to his HFrEF.   He presents today with complaints of SOB with exertion. This has been consistent for him and is not worsening or improving. He checks his BP daily, reports that at home they are usually 130's to 70's/80's. He did have one reading that was 106/68, but stated he did not feel any different with that reading. He is taking his medication as prescribed and trying to adhere to a low sodium and fluid restricted diet. His BP today is 158/82, rechecked was 150/82. He had taken him morning medications already. He states that it is always elevated when he comes to the doctor and voiced frustrations with this. He tries to help around the house with chores and occasionally is able to walk a mile. He is prohibited from further exercise due to his significant back pain.    Labwork independently reviewed: 12/22/21 - Na 142, K 4.4, Cr. 1.04, GFR 80 12/04/21 Pro BNP 471    Cardiology Studies:   Studies reviewed are outlined and summarized above. Reports included below if pertinent.   Vascular ultrasound lower extremity Doppler on August 22, 2021: RIGHT:  - No evidence of common femoral vein obstruction.    LEFT:  - No evidence of deep vein thrombosis in the lower extremity. No indirect  evidence of obstruction proximal to the inguinal ligament.  - No cystic structure found in the popliteal fossa.   Lexiscan on September 25, 2020:   Findings are consistent with prior myocardial infarction with peri-infarct ischemia. The study is intermediate risk.   No ST deviation was noted.   LV perfusion is abnormal. There is no evidence of ischemia. There is evidence of infarction.   Defect 1: There is a medium defect with moderate reduction in uptake present in the basal inferior and inferolateral location(s) that is partially reversible. There is abnormal wall motion in the defect area. Consistent with infarction and peri-infarct ischemia.   Nuclear stress EF: 40 %. The left ventricular ejection fraction is moderately decreased (30-44%). Left ventricular function is abnormal. Global function is moderately reduced. End diastolic cavity size is mildly enlarged.  Intermediate risk stress nuclear study with basal inferolateral infarction with limited peri-infarct ischemia. Mild-to-moderate reduction in left ventricular global systolic function.      Past Medical History:  Diagnosis Date   Anxiety    Coronary artery disease 02/01/2005   Stent to RCA and subsequent CABG    DM (diabetes mellitus) ([place])    HFrEF (heart failure with reduced ejection fraction) ([place])    Nuclear stress EF 40%   HTN (  hypertension)    Hypercholesterolemia    Morbid obesity ([place])     Past Surgical History:  Procedure Laterality Date   ARTERIOVENOUS GRAFT PLACEMENT W/ ENDOSCOPIC VEIN HARVEST     Right leg   CORONARY ARTERY BYPASS GRAFT  2007   Median sternotomy, xtracorporeal circulation, coronary artery bypass graft surgery x 4 using a left mamary artery graft to the left anterior descending coronary artery, with a saphenous vein graft to the obtuse marginal  branch of the left circumflex coronary artery,   CORONARY ARTERY BYPASS GRAFT  2007   sequential saphenous vein graft to the posterior descending and posterolateral branches of the right coronary artery.      Current Medications: Current Meds  Medication Sig   APIDRA SOLOSTAR 100 UNIT/ML Solostar Pen 14 Units in the morning and at bedtime.   aspirin EC 81 MG tablet Take 81 mg by mouth daily.   atorvastatin (LIPITOR) 80 MG tablet Take 1 tablet by mouth in the evening   carvedilol (COREG) 6.25 MG tablet Take 1 tablet (6.25 mg total) by mouth 2 (two) times daily.   furosemide (LASIX) 20 MG tablet Take 1 tablet (20 mg total) by mouth daily.   INVOKANA 300 MG TABS tablet Take 1 tablet by mouth daily.   pantoprazole (PROTONIX) 40 MG tablet Take 40 mg by mouth daily as needed.   sacubitril-valsartan (ENTRESTO) 97-103 MG Take 1 tablet by mouth 2 (two) times daily.   sertraline (ZOLOFT) 100 MG tablet Take 100 mg by mouth daily.     sitaGLIPtan-metformin (JANUMET) 50-1000 MG per tablet Take 1 tablet by mouth 2 (two) times daily with a meal.     spironolactone (ALDACTONE) 25 MG tablet Take 0.5 tablets (12.5 mg total) by mouth daily.   TOUJEO SOLOSTAR 300 UNIT/ML SOPN Inject 80 Units into the skin in the morning and at bedtime.   triamcinolone cream (KENALOG) 0.1 % Apply topically 2 (two) times daily as needed.   vitamin E 400 UNIT capsule Take 400 Units by mouth daily.   [DISCONTINUED] metoprolol succinate (TOPROL-XL) 50 MG 24 hr tablet Take 1 tablet (50 mg total) by mouth daily. Take with or immediately following a meal.   [DISCONTINUED] nitroGLYCERIN (NITROLINGUAL) 0.4 MG/SPRAY spray PLACE ONE SPRAY UNDER THE TONGUE EVERY 5 MINUTES FOR 3 DOSES, AS NEEDED FOR CHEST PAIN. (AS DIRECTED)    Allergies:   Penicillin g and Penicillins   Social History   Socioeconomic History   Marital status: Married    Spouse name: Not on file   Number of children: Not on file   Years of education: Not on file    Highest education level: Not on file  Occupational History   Occupation: [employer]    Comment: factory work  Tobacco Use   Smoking status: Never   Smokeless tobacco: Never  Scientific laboratory technician Use: Never used  Substance and Sexual Activity   Alcohol use: Yes    Comment: occasional   Drug use: No   Sexual activity: Not on file  Other Topics Concern   Not on file  Social History Narrative   Lives in [place], [place].     Social Determinants of Health   Financial Resource Strain: Not on file  Food Insecurity: Not on file  Transportation Needs: Not on file  Physical Activity: Not on file  Stress: Not on file  Social Connections: Not on file     Family History:  The patient's family history includes Cancer in  his father; Diabetes in his mother; Hypertension in his mother.  ROS:   Review of Systems  Constitutional: Negative.   HENT: Negative.    Eyes:  Negative for blurred vision and double vision.  Respiratory:  Negative for cough, shortness of breath and wheezing.   Cardiovascular:  Positive for leg swelling. Negative for chest pain, palpitations, orthopnea and PND.  Gastrointestinal: Negative.  Negative for melena.  Genitourinary: Negative.  Negative for hematuria.  Musculoskeletal:  Positive for back pain.  Skin:  Positive for rash (psoriasis).  Neurological:  Negative for dizziness and headaches.  Psychiatric/Behavioral: Negative.        EKG(s)/Additional Labs   EKG:  EKG is not ordered today.   Recent Labs: 08/22/2021: ALT 26; Hemoglobin 14.8; Platelets 142 12/04/2021: NT-Pro BNP 471 12/22/2021: BUN 18; Creatinine, Ser 1.04; Potassium 4.4; Sodium 142  Recent Lipid Panel No results found for: "CHOL", "TRIG", "HDL", "CHOLHDL", "VLDL", "LDLCALC", "LDLDIRECT"  PHYSICAL EXAM:    VS:  BP (!) 150/82   Pulse 70   Ht _0  (1.854 m)   Wt (!) 339 lb (153.8 kg)   SpO2 96%   BMI 44.73 kg/m   BMI: Body mass index is 44.73 kg/m.  GEN: Well  nourished, well developed male in no acute distress HEENT: normocephalic, atraumatic Neck: no JVD, carotid bruits, or masses Cardiac: RRR; no murmurs, rubs, or gallops, no edema  Respiratory:  clear to auscultation bilaterally, normal work of breathing GI: soft, nontender, nondistended, + BS MS: no deformity or atrophy Skin: warm and dry, trophic skin changes to bilateral shins Neuro:  Alert and Oriented x 3, Strength and sensation are intact, follows commands Psych: euthymic mood, full affect  Wt Readings from Last 3 Encounters:  01/06/22 (!) 339 lb (153.8 kg)  12/08/21 (!) 338 lb (153.3 kg)  11/10/21 (!) 340 lb (154.2 kg)     ASSESSMENT & PLAN:   Coronary artery disease involving native heart without angina pectoris, unspecified vessel or lesion type - Myoview in 2022 showed old MI similar to previous Myoview, medical therapy was recommended.  Nuclear stress EF 40%. Stable with no anginal symptoms. No indication for ischemic evaluation.  Continue aspirin, Lipitor, Toprol-XL, and nitroglycerin as needed. Heart healthy diet and regular cardiovascular exercise encouraged. Having issues getting his SL NTG spray refilled at his regular pharmacy, will sent to [facility] to see if they have it in stock.  HFrEF (heart failure with reduced ejection fraction) (HCC) - Entresto at max dose last visit. Spironolactone added at last visit with PharmD on 12/29/21. Will check BMET today. On entresto, spironolactone, invokana, and metoprolol succinate. Will change metoprolol succinate to coreg 6.25 mg twice daily to help with HF symptoms. Patient states that his insurance will not cover Invokana anymore and are suggesting Jardiance. Advised him that is preferred for his HF and ok to do.  Hypertension, unspecified type - Blood pressure today,  158/82, rechecked 150/82. At home, readings are consistently 130's to 70's/80's. He did have one reading that was 106/68, but stated he did not feel any different with that  reading. He is see in the HTN clinic as well and will return mid January.  Type 2 diabetes mellitus with complication, with long-term current use of insulin (HCC) - on invokana, janumet, tuojeo. He reports hypoglycemic episodes with blood sugar levels as low as 58. He states he had been trying to eat "several apples each evening" to help with the low readings in the am. Advised him to add a  protein to his evening snack before bed and to let his prescribing MD know if this continues.      Disposition: F/u with Dr. Johnsie Cancel in 8 weeks.    Medication Adjustments/Labs and Tests Ordered: Current medicines are reviewed at length with the patient today.  Concerns regarding medicines are outlined above. Medication changes, Labs and Tests ordered today are summarized above and listed in the Patient Instructions accessible in Encounters.   Signed, Trudi Ida, NP  01/06/2022 11:55 AM    [place] Phone: [phone]; Fax: [phone]

## 2022-01-06 ENCOUNTER — Ambulatory Visit: Payer: HMO | Attending: Physician Assistant | Admitting: Cardiology

## 2022-01-06 ENCOUNTER — Encounter: Payer: Self-pay | Admitting: Physician Assistant

## 2022-01-06 VITALS — BP 150/82 | HR 70 | Ht 73.0 in | Wt 339.0 lb

## 2022-01-06 DIAGNOSIS — I2510 Atherosclerotic heart disease of native coronary artery without angina pectoris: Secondary | ICD-10-CM | POA: Diagnosis not present

## 2022-01-06 DIAGNOSIS — I10 Essential (primary) hypertension: Secondary | ICD-10-CM

## 2022-01-06 DIAGNOSIS — E118 Type 2 diabetes mellitus with unspecified complications: Secondary | ICD-10-CM

## 2022-01-06 DIAGNOSIS — I5020 Unspecified systolic (congestive) heart failure: Secondary | ICD-10-CM

## 2022-01-06 DIAGNOSIS — Z794 Long term (current) use of insulin: Secondary | ICD-10-CM

## 2022-01-06 LAB — BASIC METABOLIC PANEL WITH GFR
BUN/Creatinine Ratio: 17 (ref 10–24)
BUN: 15 mg/dL (ref 8–27)
CO2: 22 mmol/L (ref 20–29)
Calcium: 9.3 mg/dL (ref 8.6–10.2)
Chloride: 102 mmol/L (ref 96–106)
Creatinine, Ser: 0.89 mg/dL (ref 0.76–1.27)
Glucose: 124 mg/dL — ABNORMAL HIGH (ref 70–99)
Potassium: 4.3 mmol/L (ref 3.5–5.2)
Sodium: 140 mmol/L (ref 134–144)
eGFR: 95 mL/min/1.73

## 2022-01-06 MED ORDER — CARVEDILOL 6.25 MG PO TABS: 6.2500 mg | ORAL_TABLET | Freq: Two times a day (BID) | ORAL | 3 refills | Status: DC

## 2022-01-06 MED ORDER — NITROGLYCERIN 0.4 MG/SPRAY TL SOLN: 1 refills | Status: DC

## 2022-01-06 NOTE — Patient Instructions (Signed)
Medication Instructions:  Your physician has recommended you make the following change in your medication:   STOP: metoprolol  START: carvedilol (coreg) 6.25 mg tablet: Take 1 tablet by mouth twice a day  *If you need a refill on your cardiac medications before your next appointment, please call your pharmacy*   Lab Work: TODAY: BMET  If you have labs (blood work) drawn today and your tests are completely normal, you will receive your results only by: [place] (if you have MyChart) OR A paper copy in the mail If you have any lab test that is abnormal or we need to change your treatment, we will call you to review the results.   Testing/Procedures: None  Follow-Up: At [facility], you and your health needs are our priority.  As part of our continuing mission to provide you with exceptional heart care, we have created designated Provider Care Teams.  These Care Teams include your primary Cardiologist (physician) and Advanced Practice Providers (APPs -  Physician Assistants and Nurse Practitioners) who all work together to provide you with the care you need, when you need it.  We recommend signing up for the patient portal called "MyChart".  Sign up information is provided on this After Visit Summary.  MyChart is used to connect with patients for Virtual Visits (Telemedicine).  Patients are able to view lab/test results, encounter notes, upcoming appointments, etc.  Non-urgent messages can be sent to your provider as well.   To learn more about what you can do with MyChart, go to [email or web address].    Your next appointment:   8 week(s)  The format for your next appointment:   In Person  Provider:   [name], MD  or Richardson Dopp, PA-C        Important Information About Sugar

## 2022-01-12 ENCOUNTER — Other Ambulatory Visit: Payer: HMO

## 2022-02-10 ENCOUNTER — Encounter: Payer: Self-pay | Admitting: Pharmacist

## 2022-02-10 ENCOUNTER — Ambulatory Visit: Payer: PPO | Attending: Internal Medicine | Admitting: Pharmacist

## 2022-02-10 VITALS — BP 150/80 | HR 86 | Resp 96 | Wt 339.0 lb

## 2022-02-10 DIAGNOSIS — I5020 Unspecified systolic (congestive) heart failure: Secondary | ICD-10-CM

## 2022-02-10 MED ORDER — CARVEDILOL 12.5 MG PO TABS: 12.5000 mg | ORAL_TABLET | Freq: Two times a day (BID) | ORAL | 3 refills | Status: DC

## 2022-02-10 NOTE — Assessment & Plan Note (Signed)
Assessment: Patient reports blood pressure well-controlled at home with heart rates in the 90s Patient's blood pressure elevated in clinic, states blood pressure is always elevated at the doctor's He denies any symptoms of fluid overload  Plan: Will increase carvedilol to 12.5 mg twice a day. Continue Entresto 97/103 mg twice daily, Jardiance 25 mg daily, lactone 12.5 mg daily and furosemide 20 mg daily Patient has follow-up with Dr. Johnsie Cancel 1/31

## 2022-02-10 NOTE — Progress Notes (Signed)
Patient ID: Justin Gonzales                 DOB: [birth year]                      MRN: [id number]      HPI: Justin Gonzales is a 67 y.o. male referred by Dr. Johnsie Cancel to HTN clinic. PMH is significant for CAD stent RCA (CABG 2007), ID-T2DM, HTN, HLD, HFrEF (EF 40%), and obesity. Patient previously met with Dr. Johnsie Cancel in October where it was noted that he had more weight gain, edema, and shortness of breath. He was found to not be taking his lasix and just using parsley tea. Patient noticed his shortness of breath when helping his wife vacuum and do things around the house. His losartan was discontinued and he started low dose Entresto.   He followed up with [name], NP in November and stated tolerating the [facility] well with controlled blood pressures at home. Entresto was titrated up to 97/103 mg BID.BMP stable. Pt was approved for pt assistance for Entresto. On 12/29/21 spironolactone 12.5 mg daily was added.  Repeat BMP was stable.  At appointment on 01/06/2022 with [name] metoprolol was changed to carvedilol 6.25 mg twice daily.   Patient presents today to Pharm.D. clinic.  Reports feeling well.  Reports his blood pressure has been 1 teens to 120s.  Concerned his heart rates is more than 90s at home.  When he was on metoprolol it was in the 80s or 70s.  He denies any dizziness lightheadedness shortness of breath or swelling.  Wears compression stockings.  He has decreased his Toujeo dose and is no longer experiencing low blood sugars in the morning.  Still eating 2 apples in the evening.  He started Jardiance yesterday instead of Invokana as his insurance does not cover Invokana anymore.  His blood pressure cuff was given to him by his PCP and they can monitor remotely.  Current GDMT meds: Entresto 97-103 mg two times daily, carvedilol 6.'25mg'$  twice a day, Jardiance '25mg'$  daily, spironolactone 12.'5mg'$  daily  Previously tried: losartan 100 mg daily  BP goal: <130/80  Family  History:  Family History  Problem Relation Age of Onset   Cancer Father    Hypertension Mother    Diabetes Mother      Social History:  reports that he has never smoked. He has never used smokeless tobacco. He reports current alcohol use. He reports that he does not use drugs.   Diet: N/A  Exercise: has pain in back that needs surgery limiting his physical activity  Home BP readings: within range   Office BP readings:  Manual: 134/78, 136/7 HR 77  Wt Readings from Last 3 Encounters:  02/10/22 (!) 339 lb (153.8 kg)  01/06/22 (!) 339 lb (153.8 kg)  12/08/21 (!) 338 lb (153.3 kg)   BP Readings from Last 3 Encounters:  02/10/22 (!) 150/80  01/06/22 (!) 150/82  12/29/21 136/74   Pulse Readings from Last 3 Encounters:  02/10/22 86  01/06/22 70  12/08/21 88    Renal function: CrCl cannot be calculated (Patient's most recent lab result is older than the maximum 21 days allowed.).  Past Medical History:  Diagnosis Date   Anxiety    Coronary artery disease 02/01/2005   Stent to RCA and subsequent CABG    DM (diabetes mellitus) ([place])    HFrEF (heart failure with reduced ejection fraction) ([place])    Nuclear stress EF 40%  HTN (hypertension)    Hypercholesterolemia    Morbid obesity ([place])     Current Outpatient Medications on File Prior to Visit  Medication Sig Dispense Refill   JARDIANCE 25 MG TABS tablet Take 25 mg by mouth daily.     APIDRA SOLOSTAR 100 UNIT/ML Solostar Pen 24 Units in the morning and at bedtime.     aspirin EC 81 MG tablet Take 81 mg by mouth daily.     atorvastatin (LIPITOR) 80 MG tablet Take 1 tablet by mouth in the evening 90 tablet 3   furosemide (LASIX) 20 MG tablet Take 1 tablet (20 mg total) by mouth daily. 90 tablet 3   nitroGLYCERIN (NITROLINGUAL) 0.4 MG/SPRAY spray PLACE ONE SPRAY UNDER THE TONGUE EVERY 5 MINUTES FOR 3 DOSES, AS NEEDED FOR CHEST PAIN. (AS DIRECTED) 12 g 1   pantoprazole (PROTONIX) 40 MG tablet Take 40 mg by mouth daily as  needed.     sacubitril-valsartan (ENTRESTO) 97-103 MG Take 1 tablet by mouth 2 (two) times daily. 180 tablet 3   sertraline (ZOLOFT) 100 MG tablet Take 100 mg by mouth daily.       sitaGLIPtan-metformin (JANUMET) 50-1000 MG per tablet Take 1 tablet by mouth 2 (two) times daily with a meal.       spironolactone (ALDACTONE) 25 MG tablet Take 0.5 tablets (12.5 mg total) by mouth daily. 30 tablet 3   TOUJEO SOLOSTAR 300 UNIT/ML SOPN Inject 76 Units into the skin in the morning and at bedtime.     triamcinolone cream (KENALOG) 0.1 % Apply topically 2 (two) times daily as needed.     vitamin E 400 UNIT capsule Take 400 Units by mouth daily.     No current facility-administered medications on file prior to visit.    Allergies  Allergen Reactions   Penicillin G     Other Reaction(s): rash,ok with cephalosporins   Penicillins Other (See Comments)    Unknow, per pt Unknow, per pt   Blood pressure (!) 150/80, pulse 86, resp. rate (!) 96, weight (!) 339 lb (153.8 kg).  Assessment/Plan: HYPERTENSION CONTROL Vitals:   02/10/22 1034 02/10/22 1329  BP: (!) 148/80 (!) 150/80    The patient's blood pressure is elevated above target today.  In order to address the patient's elevated BP: A current anti-hypertensive medication was adjusted today.     HFmrEF (heart failure with mildly reduced ejection fraction) (HCC) Assessment: Patient reports blood pressure well-controlled at home with heart rates in the 90s Patient's blood pressure elevated in clinic, states blood pressure is always elevated at the doctor's He denies any symptoms of fluid overload  Plan: Will increase carvedilol to 12.5 mg twice a day. Continue Entresto 97/103 mg twice daily, Jardiance 25 mg daily, lactone 12.5 mg daily and furosemide 20 mg daily Patient has follow-up with Dr. Johnsie Cancel 1/31   Thank you,   Ramond Dial, Pharm.D, BCPS, CPP [facility]  [address], [place], [place] [address]  Phone: [phone]; Fax: [phone]

## 2022-02-10 NOTE — Patient Instructions (Addendum)
Please increase your carvedilol 12.'5mg'$  twice a day. You may take 2 of the 6.'25mg'$  tablets to equal 12.'5mg'$  two times a day until you run out. Then start taking carvedilol 12.'5mg'$  tablets 1 tablet twice a day.  Continue Entresto 97-103 mg two times daily, Jardiance '25mg'$  daily, spironolactone 12.'5mg'$  daily

## 2022-02-16 DIAGNOSIS — L400 Psoriasis vulgaris: Secondary | ICD-10-CM | POA: Diagnosis not present

## 2022-02-18 NOTE — Progress Notes (Signed)
Patient ID: Justin Gonzales, male   DOB: [birth year], 67 y.o.   MRN: [id number]     67 y.o. CAD stent RCA then CABG 2007  On insulin for DM. Does work out at [employer] No angina. Compliant with meds Last myovue 2022 mostly inferior infarct EF 40%   Has son in [place] and daughter in [place] Originally from [place]   BP readings at home fine compliant with meds Coreg increased by Pharm D 02/10/22   He finally got disability but for PTSD  He has gained a lot of weight when he went back home to [place] for 2 monts   Still heavy Activity limited by back   ROS: Denies fever, malais, weight loss, blurry vision, decreased visual acuity, cough, sputum, SOB, hemoptysis, pleuritic pain, palpitaitons, heartburn, abdominal pain, melena, lower extremity edema, claudication, or rash.  All other systems reviewed and negative  General:  BP (!) 132/96   Pulse 80   Ht '6\' 1"'$  (1.854 m)   Wt (!) 340 lb (154.2 kg)   BMI 44.86 kg/m   Affect appropriate Overweight white male  HEENT: normal Neck supple with no adenopathy JVP normal no bruits no thyromegaly Lungs clear with no wheezing and good diaphragmatic motion Heart:  S1/S2 no murmur, no rub, gallop or click PMI normal Abdomen: benighn, BS positve, no tenderness, no AAA no bruit.  No HSM or HJR Distal pulses intact with no bruits Plus 2 bilateral  edema with echymotic discoloration  Neuro non-focal Skin psoriasis on back arms and legs  No muscular weakness   Allergies  Penicillin g and Penicillins  Electrocardiogram:   03/03/2022  SR rate 80 ? Old IMI IVCD lateral T wave changes    Assessment and Plan CAD/CABG:  2007 : Compliant with meds Myovue 09/24/20 inferior infarct EF 40% similar to 2014  Psoriasis:  Improved with green tea extract f/u dermatology Rx cellulitis 08/22/21 left leg   HTN:  Home readings fine some white coat component continue current meds weight loss important coreg increased 02/2022   DM: Discussed low carb  diet.  Target hemoglobin A1c is 6.5 or less.  Continue current medications.  Chol:  On statin labs with primary   Edema: dependant from obesity and venous reflux Lasix 20 mg called in encouraged compliance   DCM:  Ischemic Continue entresto, aldactone, coreg jardiance and lasix     F/U with me in 6 months    Justin Gonzales

## 2022-03-03 ENCOUNTER — Encounter: Payer: Self-pay | Admitting: Cardiovascular Disease

## 2022-03-03 ENCOUNTER — Ambulatory Visit: Payer: HMO | Attending: Cardiovascular Disease | Admitting: Cardiovascular Disease

## 2022-03-03 DIAGNOSIS — I10 Essential (primary) hypertension: Secondary | ICD-10-CM

## 2022-03-03 DIAGNOSIS — I2510 Atherosclerotic heart disease of native coronary artery without angina pectoris: Secondary | ICD-10-CM | POA: Diagnosis not present

## 2022-03-03 DIAGNOSIS — I5020 Unspecified systolic (congestive) heart failure: Secondary | ICD-10-CM | POA: Diagnosis not present

## 2022-03-03 NOTE — Patient Instructions (Signed)
Medication Instructions:  Your physician recommends that you continue on your current medications as directed. Please refer to the Current Medication list given to you today.  *If you need a refill on your cardiac medications before your next appointment, please call your pharmacy*   Lab Work: NONE If you have labs (blood work) drawn today and your tests are completely normal, you will receive your results only by: MyChart Message (if you have MyChart) OR A paper copy in the mail If you have any lab test that is abnormal or we need to change your treatment, we will call you to review the results.   Testing/Procedures: NONE   Follow-Up: At [facility], you and your health needs are our priority.  As part of our continuing mission to provide you with exceptional heart care, we have created designated Provider Care Teams.  These Care Teams include your primary Cardiologist (physician) and Advanced Practice Providers (APPs -  Physician Assistants and Nurse Practitioners) who all work together to provide you with the care you need, when you need it.  We recommend signing up for the patient portal called "MyChart".  Sign up information is provided on this After Visit Summary.  MyChart is used to connect with patients for Virtual Visits (Telemedicine).  Patients are able to view lab/test results, encounter notes, upcoming appointments, etc.  Non-urgent messages can be sent to your provider as well.   To learn more about what you can do with MyChart, go to [email or web address].    Your next appointment:   6 month(s)  Provider:    Peter Nishan, MD 

## 2022-03-30 DIAGNOSIS — I2510 Atherosclerotic heart disease of native coronary artery without angina pectoris: Secondary | ICD-10-CM | POA: Diagnosis not present

## 2022-03-30 DIAGNOSIS — N182 Chronic kidney disease, stage 2 (mild): Secondary | ICD-10-CM | POA: Diagnosis not present

## 2022-03-30 DIAGNOSIS — I25810 Atherosclerosis of coronary artery bypass graft(s) without angina pectoris: Secondary | ICD-10-CM | POA: Diagnosis not present

## 2022-03-30 DIAGNOSIS — E782 Mixed hyperlipidemia: Secondary | ICD-10-CM | POA: Diagnosis not present

## 2022-03-30 DIAGNOSIS — E1165 Type 2 diabetes mellitus with hyperglycemia: Secondary | ICD-10-CM | POA: Diagnosis not present

## 2022-03-30 DIAGNOSIS — I10 Essential (primary) hypertension: Secondary | ICD-10-CM | POA: Diagnosis not present

## 2022-03-30 DIAGNOSIS — R809 Proteinuria, unspecified: Secondary | ICD-10-CM | POA: Diagnosis not present

## 2022-03-30 DIAGNOSIS — E1151 Type 2 diabetes mellitus with diabetic peripheral angiopathy without gangrene: Secondary | ICD-10-CM | POA: Diagnosis not present

## 2022-03-31 DIAGNOSIS — L03115 Cellulitis of right lower limb: Secondary | ICD-10-CM | POA: Diagnosis not present

## 2022-04-02 DIAGNOSIS — E113413 Type 2 diabetes mellitus with severe nonproliferative diabetic retinopathy with macular edema, bilateral: Secondary | ICD-10-CM | POA: Diagnosis not present

## 2022-04-02 DIAGNOSIS — H35372 Puckering of macula, left eye: Secondary | ICD-10-CM | POA: Diagnosis not present

## 2022-04-02 DIAGNOSIS — H43823 Vitreomacular adhesion, bilateral: Secondary | ICD-10-CM | POA: Diagnosis not present

## 2022-04-02 DIAGNOSIS — H35033 Hypertensive retinopathy, bilateral: Secondary | ICD-10-CM | POA: Diagnosis not present

## 2022-04-19 DIAGNOSIS — H35372 Puckering of macula, left eye: Secondary | ICD-10-CM | POA: Diagnosis not present

## 2022-04-19 DIAGNOSIS — H43823 Vitreomacular adhesion, bilateral: Secondary | ICD-10-CM | POA: Diagnosis not present

## 2022-04-19 DIAGNOSIS — H35033 Hypertensive retinopathy, bilateral: Secondary | ICD-10-CM | POA: Diagnosis not present

## 2022-04-19 DIAGNOSIS — E113511 Type 2 diabetes mellitus with proliferative diabetic retinopathy with macular edema, right eye: Secondary | ICD-10-CM | POA: Diagnosis not present

## 2022-04-19 DIAGNOSIS — E113412 Type 2 diabetes mellitus with severe nonproliferative diabetic retinopathy with macular edema, left eye: Secondary | ICD-10-CM | POA: Diagnosis not present

## 2022-05-03 DIAGNOSIS — E113511 Type 2 diabetes mellitus with proliferative diabetic retinopathy with macular edema, right eye: Secondary | ICD-10-CM | POA: Diagnosis not present

## 2022-05-11 DIAGNOSIS — L400 Psoriasis vulgaris: Secondary | ICD-10-CM | POA: Diagnosis not present

## 2022-05-24 DIAGNOSIS — E113511 Type 2 diabetes mellitus with proliferative diabetic retinopathy with macular edema, right eye: Secondary | ICD-10-CM | POA: Diagnosis not present

## 2022-06-22 ENCOUNTER — Telehealth: Payer: Self-pay | Admitting: Cardiovascular Disease

## 2022-06-22 MED ORDER — NITROGLYCERIN 0.4 MG/SPRAY TL SOLN: 2 refills | Status: DC

## 2022-06-22 NOTE — Telephone Encounter (Signed)
Pt's medication was sent to pt's pharmacy as requested. Confirmation received.  °

## 2022-06-22 NOTE — Telephone Encounter (Signed)
*  STAT* If patient is at the pharmacy, call can be transferred to refill team.   1. Which medications need to be refilled? (please list name of each medication and dose if known) nitroGLYCERIN (NITROLINGUAL) 0.4 MG/SPRAY spray   2. Which pharmacy/location (including street and city if local pharmacy) is medication to be sent to? [facility] [address], [place] - [address]   3. Do they need a 30 day or 90 day supply? 30

## 2022-06-23 DIAGNOSIS — E1151 Type 2 diabetes mellitus with diabetic peripheral angiopathy without gangrene: Secondary | ICD-10-CM | POA: Diagnosis not present

## 2022-06-23 DIAGNOSIS — N182 Chronic kidney disease, stage 2 (mild): Secondary | ICD-10-CM | POA: Diagnosis not present

## 2022-06-23 DIAGNOSIS — E782 Mixed hyperlipidemia: Secondary | ICD-10-CM | POA: Diagnosis not present

## 2022-06-23 DIAGNOSIS — R809 Proteinuria, unspecified: Secondary | ICD-10-CM | POA: Diagnosis not present

## 2022-06-23 DIAGNOSIS — I872 Venous insufficiency (chronic) (peripheral): Secondary | ICD-10-CM | POA: Diagnosis not present

## 2022-06-23 DIAGNOSIS — I10 Essential (primary) hypertension: Secondary | ICD-10-CM | POA: Diagnosis not present

## 2022-06-23 DIAGNOSIS — I739 Peripheral vascular disease, unspecified: Secondary | ICD-10-CM | POA: Diagnosis not present

## 2022-06-23 DIAGNOSIS — I2510 Atherosclerotic heart disease of native coronary artery without angina pectoris: Secondary | ICD-10-CM | POA: Diagnosis not present

## 2022-06-23 DIAGNOSIS — I119 Hypertensive heart disease without heart failure: Secondary | ICD-10-CM | POA: Diagnosis not present

## 2022-06-30 DIAGNOSIS — I119 Hypertensive heart disease without heart failure: Secondary | ICD-10-CM | POA: Diagnosis not present

## 2022-06-30 DIAGNOSIS — E1121 Type 2 diabetes mellitus with diabetic nephropathy: Secondary | ICD-10-CM | POA: Diagnosis not present

## 2022-06-30 DIAGNOSIS — L409 Psoriasis, unspecified: Secondary | ICD-10-CM | POA: Diagnosis not present

## 2022-06-30 DIAGNOSIS — N182 Chronic kidney disease, stage 2 (mild): Secondary | ICD-10-CM | POA: Diagnosis not present

## 2022-06-30 DIAGNOSIS — I10 Essential (primary) hypertension: Secondary | ICD-10-CM | POA: Diagnosis not present

## 2022-06-30 DIAGNOSIS — E1151 Type 2 diabetes mellitus with diabetic peripheral angiopathy without gangrene: Secondary | ICD-10-CM | POA: Diagnosis not present

## 2022-06-30 DIAGNOSIS — I2510 Atherosclerotic heart disease of native coronary artery without angina pectoris: Secondary | ICD-10-CM | POA: Diagnosis not present

## 2022-06-30 DIAGNOSIS — I5020 Unspecified systolic (congestive) heart failure: Secondary | ICD-10-CM | POA: Diagnosis not present

## 2022-06-30 DIAGNOSIS — Z23 Encounter for immunization: Secondary | ICD-10-CM | POA: Diagnosis not present

## 2022-06-30 DIAGNOSIS — E782 Mixed hyperlipidemia: Secondary | ICD-10-CM | POA: Diagnosis not present

## 2022-06-30 DIAGNOSIS — I739 Peripheral vascular disease, unspecified: Secondary | ICD-10-CM | POA: Diagnosis not present

## 2022-06-30 DIAGNOSIS — I872 Venous insufficiency (chronic) (peripheral): Secondary | ICD-10-CM | POA: Diagnosis not present

## 2022-07-05 DIAGNOSIS — H35372 Puckering of macula, left eye: Secondary | ICD-10-CM | POA: Diagnosis not present

## 2022-07-05 DIAGNOSIS — H43823 Vitreomacular adhesion, bilateral: Secondary | ICD-10-CM | POA: Diagnosis not present

## 2022-07-05 DIAGNOSIS — E113412 Type 2 diabetes mellitus with severe nonproliferative diabetic retinopathy with macular edema, left eye: Secondary | ICD-10-CM | POA: Diagnosis not present

## 2022-07-05 DIAGNOSIS — H35033 Hypertensive retinopathy, bilateral: Secondary | ICD-10-CM | POA: Diagnosis not present

## 2022-07-05 DIAGNOSIS — E113511 Type 2 diabetes mellitus with proliferative diabetic retinopathy with macular edema, right eye: Secondary | ICD-10-CM | POA: Diagnosis not present

## 2022-08-03 DIAGNOSIS — L400 Psoriasis vulgaris: Secondary | ICD-10-CM | POA: Diagnosis not present

## 2022-08-09 DIAGNOSIS — Z961 Presence of intraocular lens: Secondary | ICD-10-CM | POA: Diagnosis not present

## 2022-08-09 DIAGNOSIS — H43823 Vitreomacular adhesion, bilateral: Secondary | ICD-10-CM | POA: Diagnosis not present

## 2022-08-09 DIAGNOSIS — E113412 Type 2 diabetes mellitus with severe nonproliferative diabetic retinopathy with macular edema, left eye: Secondary | ICD-10-CM | POA: Diagnosis not present

## 2022-08-09 DIAGNOSIS — H4311 Vitreous hemorrhage, right eye: Secondary | ICD-10-CM | POA: Diagnosis not present

## 2022-08-09 DIAGNOSIS — H35033 Hypertensive retinopathy, bilateral: Secondary | ICD-10-CM | POA: Diagnosis not present

## 2022-08-09 DIAGNOSIS — E113511 Type 2 diabetes mellitus with proliferative diabetic retinopathy with macular edema, right eye: Secondary | ICD-10-CM | POA: Diagnosis not present

## 2022-08-09 DIAGNOSIS — H35372 Puckering of macula, left eye: Secondary | ICD-10-CM | POA: Diagnosis not present

## 2022-08-31 ENCOUNTER — Other Ambulatory Visit: Payer: Self-pay | Admitting: Cardiovascular Disease

## 2022-09-13 DIAGNOSIS — E782 Mixed hyperlipidemia: Secondary | ICD-10-CM | POA: Diagnosis not present

## 2022-09-13 DIAGNOSIS — E1151 Type 2 diabetes mellitus with diabetic peripheral angiopathy without gangrene: Secondary | ICD-10-CM | POA: Diagnosis not present

## 2022-09-13 DIAGNOSIS — R809 Proteinuria, unspecified: Secondary | ICD-10-CM | POA: Diagnosis not present

## 2022-09-13 DIAGNOSIS — N182 Chronic kidney disease, stage 2 (mild): Secondary | ICD-10-CM | POA: Diagnosis not present

## 2022-09-13 DIAGNOSIS — E1165 Type 2 diabetes mellitus with hyperglycemia: Secondary | ICD-10-CM | POA: Diagnosis not present

## 2022-09-13 DIAGNOSIS — I10 Essential (primary) hypertension: Secondary | ICD-10-CM | POA: Diagnosis not present

## 2022-09-13 DIAGNOSIS — E1121 Type 2 diabetes mellitus with diabetic nephropathy: Secondary | ICD-10-CM | POA: Diagnosis not present

## 2022-09-14 DIAGNOSIS — H4311 Vitreous hemorrhage, right eye: Secondary | ICD-10-CM | POA: Diagnosis not present

## 2022-09-14 DIAGNOSIS — H35033 Hypertensive retinopathy, bilateral: Secondary | ICD-10-CM | POA: Diagnosis not present

## 2022-09-14 DIAGNOSIS — H35372 Puckering of macula, left eye: Secondary | ICD-10-CM | POA: Diagnosis not present

## 2022-09-14 DIAGNOSIS — E113412 Type 2 diabetes mellitus with severe nonproliferative diabetic retinopathy with macular edema, left eye: Secondary | ICD-10-CM | POA: Diagnosis not present

## 2022-09-14 DIAGNOSIS — E113511 Type 2 diabetes mellitus with proliferative diabetic retinopathy with macular edema, right eye: Secondary | ICD-10-CM | POA: Diagnosis not present

## 2022-09-14 DIAGNOSIS — Z961 Presence of intraocular lens: Secondary | ICD-10-CM | POA: Diagnosis not present

## 2022-11-03 DIAGNOSIS — L400 Psoriasis vulgaris: Secondary | ICD-10-CM | POA: Diagnosis not present

## 2022-11-04 ENCOUNTER — Other Ambulatory Visit: Payer: Self-pay | Admitting: Cardiovascular Disease

## 2022-11-08 DIAGNOSIS — E113311 Type 2 diabetes mellitus with moderate nonproliferative diabetic retinopathy with macular edema, right eye: Secondary | ICD-10-CM | POA: Diagnosis not present

## 2022-11-11 NOTE — Progress Notes (Signed)
Patient ID: Justin Gonzales, male   DOB: Oct 26, 1955, 67 y.o.   MRN: [id number]     67 y.o. CAD stent RCA then CABG 2007  On insulin for DM. Does work out at [employer] No angina. Compliant with meds Last myovue 2022 mostly inferior infarct EF 40%   Has son in [place] and daughter in [place] Originally from [place]   BP readings at home fine compliant with meds Coreg increased by Pharm D 02/10/22   He finally got disability but for PTSD  He has gained a lot of weight when he went back home to [place] for 2 monts   Still heavy Activity limited by back   Was back in [place] for 2 months looking into [employer] pension Needs help with [employer] patient assistance Volume seems up Has some cellulitis in LE;s due to chronic venous insufficiency and edema   ROS: Denies fever, malais, weight loss, blurry vision, decreased visual acuity, cough, sputum, SOB, hemoptysis, pleuritic pain, palpitaitons, heartburn, abdominal pain, melena, lower extremity edema, claudication, or rash.  All other systems reviewed and negative  General:  There were no vitals taken for this visit.  Affect appropriate Overweight white male  HEENT: normal Neck supple with no adenopathy JVP normal no bruits no thyromegaly Lungs clear with no wheezing and good diaphragmatic motion Heart:  S1/S2 no murmur, no rub, gallop or click PMI normal Abdomen: benighn, BS positve, no tenderness, no AAA no bruit.  No HSM or HJR Distal pulses intact with no bruits Plus 2 bilateral  edema with echymotic discoloration  Neuro non-focal Skin psoriasis on back arms and legs  No muscular weakness   Allergies  Penicillin g and Penicillins  Electrocardiogram:   11/23/2022  SR rate 80 ? Old IMI IVCD lateral T wave changes    Assessment and Plan CAD/CABG:  2007 : Compliant with meds Myovue 09/24/20 inferior infarct EF 40% similar to 2014  Psoriasis:  Improved with green tea extract f/u dermatology Rx cellulitis 08/22/21 left leg   HTN:   Home readings fine some white coat component continue current meds weight loss important coreg increased 02/2022   DM: Discussed low carb diet.  Target hemoglobin A1c is 6.5 or less.  Continue current medications.  Chol:  On statin labs with primary   Edema: dependant from obesity and venous reflux Lasix increase to 40 mg daily Continue Aldactone 25 mg daily Allergic to PCN will call in doxycyline 100 mg bid to prevent further infection   DCM:  Ischemic Continue entresto, aldactone, coreg jardiance and lasix    Obesity:  discussed diet again Primary placed on on Mounjaro   Increase Lasix 40 mg daily Doxycycline 100 mg bid for 7 days    F/U with me in 6 months    Charlton Haws

## 2022-11-23 ENCOUNTER — Ambulatory Visit: Payer: HMO | Attending: Cardiovascular Disease | Admitting: Cardiovascular Disease

## 2022-11-23 ENCOUNTER — Telehealth: Payer: Self-pay | Admitting: Cardiovascular Disease

## 2022-11-23 VITALS — BP 126/68 | HR 78 | Ht 73.0 in | Wt 343.0 lb

## 2022-11-23 DIAGNOSIS — Z951 Presence of aortocoronary bypass graft: Secondary | ICD-10-CM | POA: Diagnosis not present

## 2022-11-23 DIAGNOSIS — I255 Ischemic cardiomyopathy: Secondary | ICD-10-CM | POA: Diagnosis not present

## 2022-11-23 DIAGNOSIS — I10 Essential (primary) hypertension: Secondary | ICD-10-CM | POA: Diagnosis not present

## 2022-11-23 DIAGNOSIS — E7800 Pure hypercholesterolemia, unspecified: Secondary | ICD-10-CM

## 2022-11-23 MED ORDER — FUROSEMIDE 40 MG PO TABS: 40.0000 mg | ORAL_TABLET | Freq: Every day | ORAL | 3 refills | Status: DC

## 2022-11-23 MED ORDER — DOXYCYCLINE HYCLATE 100 MG PO TBEC: 100.0000 mg | DELAYED_RELEASE_TABLET | Freq: Two times a day (BID) | ORAL | 1 refills | Status: DC

## 2022-11-23 NOTE — Telephone Encounter (Signed)
Called pharmacy back to let them know that it was fine to use another form of Doxycycline.

## 2022-11-23 NOTE — Telephone Encounter (Signed)
Pharmacy calling back to confirm if its okay if they give pt another form of the medication

## 2022-11-23 NOTE — Telephone Encounter (Signed)
Pharmacy requesting a different medication. Please address

## 2022-11-23 NOTE — Telephone Encounter (Signed)
Pt c/o medication issue:  1. Name of Medication: Doxycycline- (EC)  2. How are you currently taking this medication (dosage and times per day)?   3. Are you having a reaction (difficulty breathing--STAT)? [facility] [employer], [place]  4. What is your medication issue? Pharmacist- said they do not have this medicine- wants to know if they can use any other form of it?

## 2022-11-23 NOTE — Patient Instructions (Addendum)
Medication Instructions:  Your physician has recommended you make the following change in your medication:  1-INCREASE Lasix (furosemide) 40 mg by mouth daily. 2- TAKE doxycycline 100 mg by mouth twice daily for 7 days.  *If you need a refill on your cardiac medications before your next appointment, please call your pharmacy*  Lab Work: Your physician recommends that you have lab work in 3 weeks for CMET, CBC, and BNP If you have labs (blood work) drawn today and your tests are completely normal, you will receive your results only by: MyChart Message (if you have MyChart) OR A paper copy in the mail If you have any lab test that is abnormal or we need to change your treatment, we will call you to review the results.  Testing/Procedures: None ordered today.  Follow-Up: At [facility], you and your health needs are our priority.  As part of our continuing mission to provide you with exceptional heart care, we have created designated Provider Care Teams.  These Care Teams include your primary Cardiologist (physician) and Advanced Practice Providers (APPs -  Physician Assistants and Nurse Practitioners) who all work together to provide you with the care you need, when you need it.  We recommend signing up for the patient portal called "MyChart".  Sign up information is provided on this After Visit Summary.  MyChart is used to connect with patients for Virtual Visits (Telemedicine).  Patients are able to view lab/test results, encounter notes, upcoming appointments, etc.  Non-urgent messages can be sent to your provider as well.   To learn more about what you can do with MyChart, go to [email or web address].    Your next appointment:   6 month(s)  Provider:   Charlton Haws, MD

## 2022-11-25 ENCOUNTER — Other Ambulatory Visit (HOSPITAL_COMMUNITY): Payer: Self-pay

## 2022-11-25 NOTE — Telephone Encounter (Deleted)
Status is pending. If patient requests an update on application status, please refer them to reach [employer] at [phone].

## 2022-11-25 NOTE — Telephone Encounter (Addendum)
ERROR

## 2022-12-01 ENCOUNTER — Other Ambulatory Visit: Payer: Self-pay | Admitting: Cardiovascular Disease

## 2022-12-13 DIAGNOSIS — L400 Psoriasis vulgaris: Secondary | ICD-10-CM | POA: Diagnosis not present

## 2022-12-13 DIAGNOSIS — L4059 Other psoriatic arthropathy: Secondary | ICD-10-CM | POA: Diagnosis not present

## 2022-12-14 DIAGNOSIS — I5020 Unspecified systolic (congestive) heart failure: Secondary | ICD-10-CM | POA: Diagnosis not present

## 2022-12-14 DIAGNOSIS — E1121 Type 2 diabetes mellitus with diabetic nephropathy: Secondary | ICD-10-CM | POA: Diagnosis not present

## 2022-12-14 DIAGNOSIS — E782 Mixed hyperlipidemia: Secondary | ICD-10-CM | POA: Diagnosis not present

## 2022-12-14 DIAGNOSIS — I10 Essential (primary) hypertension: Secondary | ICD-10-CM | POA: Diagnosis not present

## 2022-12-14 DIAGNOSIS — I739 Peripheral vascular disease, unspecified: Secondary | ICD-10-CM | POA: Diagnosis not present

## 2022-12-14 DIAGNOSIS — E1151 Type 2 diabetes mellitus with diabetic peripheral angiopathy without gangrene: Secondary | ICD-10-CM | POA: Diagnosis not present

## 2022-12-20 DIAGNOSIS — H4311 Vitreous hemorrhage, right eye: Secondary | ICD-10-CM | POA: Diagnosis not present

## 2022-12-20 DIAGNOSIS — E113511 Type 2 diabetes mellitus with proliferative diabetic retinopathy with macular edema, right eye: Secondary | ICD-10-CM | POA: Diagnosis not present

## 2022-12-20 DIAGNOSIS — H35033 Hypertensive retinopathy, bilateral: Secondary | ICD-10-CM | POA: Diagnosis not present

## 2022-12-20 DIAGNOSIS — Z961 Presence of intraocular lens: Secondary | ICD-10-CM | POA: Diagnosis not present

## 2022-12-20 DIAGNOSIS — E113412 Type 2 diabetes mellitus with severe nonproliferative diabetic retinopathy with macular edema, left eye: Secondary | ICD-10-CM | POA: Diagnosis not present

## 2022-12-20 DIAGNOSIS — H35372 Puckering of macula, left eye: Secondary | ICD-10-CM | POA: Diagnosis not present

## 2022-12-22 DIAGNOSIS — I10 Essential (primary) hypertension: Secondary | ICD-10-CM | POA: Diagnosis not present

## 2022-12-22 DIAGNOSIS — E1165 Type 2 diabetes mellitus with hyperglycemia: Secondary | ICD-10-CM | POA: Diagnosis not present

## 2022-12-22 DIAGNOSIS — I25810 Atherosclerosis of coronary artery bypass graft(s) without angina pectoris: Secondary | ICD-10-CM | POA: Diagnosis not present

## 2022-12-22 DIAGNOSIS — E782 Mixed hyperlipidemia: Secondary | ICD-10-CM | POA: Diagnosis not present

## 2022-12-22 DIAGNOSIS — Z23 Encounter for immunization: Secondary | ICD-10-CM | POA: Diagnosis not present

## 2022-12-23 ENCOUNTER — Other Ambulatory Visit: Payer: Self-pay

## 2022-12-23 MED ORDER — SACUBITRIL-VALSARTAN 97-103 MG PO TABS: 1.0000 | ORAL_TABLET | Freq: Two times a day (BID) | ORAL | 3 refills | Status: DC

## 2022-12-27 DIAGNOSIS — L4059 Other psoriatic arthropathy: Secondary | ICD-10-CM | POA: Diagnosis not present

## 2022-12-27 DIAGNOSIS — L400 Psoriasis vulgaris: Secondary | ICD-10-CM | POA: Diagnosis not present

## 2022-12-29 DIAGNOSIS — Z0000 Encounter for general adult medical examination without abnormal findings: Secondary | ICD-10-CM | POA: Diagnosis not present

## 2023-01-05 DIAGNOSIS — I119 Hypertensive heart disease without heart failure: Secondary | ICD-10-CM | POA: Diagnosis not present

## 2023-01-05 DIAGNOSIS — E1151 Type 2 diabetes mellitus with diabetic peripheral angiopathy without gangrene: Secondary | ICD-10-CM | POA: Diagnosis not present

## 2023-01-05 DIAGNOSIS — E782 Mixed hyperlipidemia: Secondary | ICD-10-CM | POA: Diagnosis not present

## 2023-01-05 DIAGNOSIS — I739 Peripheral vascular disease, unspecified: Secondary | ICD-10-CM | POA: Diagnosis not present

## 2023-01-05 DIAGNOSIS — I10 Essential (primary) hypertension: Secondary | ICD-10-CM | POA: Diagnosis not present

## 2023-01-05 DIAGNOSIS — I5020 Unspecified systolic (congestive) heart failure: Secondary | ICD-10-CM | POA: Diagnosis not present

## 2023-01-05 DIAGNOSIS — I2510 Atherosclerotic heart disease of native coronary artery without angina pectoris: Secondary | ICD-10-CM | POA: Diagnosis not present

## 2023-01-05 DIAGNOSIS — E1121 Type 2 diabetes mellitus with diabetic nephropathy: Secondary | ICD-10-CM | POA: Diagnosis not present

## 2023-01-05 DIAGNOSIS — N182 Chronic kidney disease, stage 2 (mild): Secondary | ICD-10-CM | POA: Diagnosis not present

## 2023-01-05 DIAGNOSIS — Z23 Encounter for immunization: Secondary | ICD-10-CM | POA: Diagnosis not present

## 2023-01-05 DIAGNOSIS — Z0000 Encounter for general adult medical examination without abnormal findings: Secondary | ICD-10-CM | POA: Diagnosis not present

## 2023-01-05 DIAGNOSIS — I872 Venous insufficiency (chronic) (peripheral): Secondary | ICD-10-CM | POA: Diagnosis not present

## 2023-01-27 DIAGNOSIS — L400 Psoriasis vulgaris: Secondary | ICD-10-CM | POA: Diagnosis not present

## 2023-02-03 ENCOUNTER — Encounter: Payer: Self-pay | Admitting: Pharmacist

## 2023-02-07 ENCOUNTER — Telehealth: Payer: Self-pay

## 2023-02-07 ENCOUNTER — Other Ambulatory Visit (HOSPITAL_COMMUNITY): Payer: Self-pay

## 2023-02-07 NOTE — Telephone Encounter (Signed)
 Application was received end of Oct 2024. At that time patient had a zero dollar copay for Entresto so PAP was not needed. Patient is requesting app be sent off now.  PAP: Application for Justin Gonzales has been submitted to PAP Companies: [employer], via fax

## 2023-02-08 NOTE — Telephone Encounter (Signed)
 PAP: Patient application pending due to FAX FROM [employer] STATES THEY NEED MORE RECENT INCOME DOCUMENTS, PT MADE AWARE

## 2023-02-09 ENCOUNTER — Telehealth: Payer: Self-pay | Admitting: Cardiovascular Disease

## 2023-02-09 NOTE — Telephone Encounter (Signed)
 Yes, the ones he sent before were from 2022. I got a fax back from [employer] that they needed more recent forms.

## 2023-02-09 NOTE — Telephone Encounter (Signed)
 Patient has dropped off his 2023 Income Taxes for prescription assistance.  I will place this documentation in Dr. Fabio Bering Box.  Thank you.  Please mail back to patient when you have completed your review of his tax documents.

## 2023-02-14 ENCOUNTER — Other Ambulatory Visit (HOSPITAL_COMMUNITY): Payer: Self-pay

## 2023-02-14 DIAGNOSIS — E113511 Type 2 diabetes mellitus with proliferative diabetic retinopathy with macular edema, right eye: Secondary | ICD-10-CM | POA: Diagnosis not present

## 2023-02-14 NOTE — Telephone Encounter (Signed)
 Faxed tax documents to [employer]

## 2023-02-15 ENCOUNTER — Encounter: Payer: Self-pay | Admitting: Pharmacy Technician

## 2023-02-21 ENCOUNTER — Telehealth: Payer: Self-pay | Admitting: Pharmacy Technician

## 2023-02-21 NOTE — Telephone Encounter (Signed)
Faxed new tax information.

## 2023-02-21 NOTE — Telephone Encounter (Signed)
   Pt made aware was approved via mychart as well on 02/15/23

## 2023-02-26 ENCOUNTER — Other Ambulatory Visit: Payer: Self-pay | Admitting: Cardiovascular Disease

## 2023-04-11 DIAGNOSIS — E113511 Type 2 diabetes mellitus with proliferative diabetic retinopathy with macular edema, right eye: Secondary | ICD-10-CM | POA: Diagnosis not present

## 2023-04-11 DIAGNOSIS — E113412 Type 2 diabetes mellitus with severe nonproliferative diabetic retinopathy with macular edema, left eye: Secondary | ICD-10-CM | POA: Diagnosis not present

## 2023-04-11 DIAGNOSIS — H35372 Puckering of macula, left eye: Secondary | ICD-10-CM | POA: Diagnosis not present

## 2023-04-11 DIAGNOSIS — H4311 Vitreous hemorrhage, right eye: Secondary | ICD-10-CM | POA: Diagnosis not present

## 2023-04-11 DIAGNOSIS — H35033 Hypertensive retinopathy, bilateral: Secondary | ICD-10-CM | POA: Diagnosis not present

## 2023-04-11 DIAGNOSIS — Z961 Presence of intraocular lens: Secondary | ICD-10-CM | POA: Diagnosis not present

## 2023-04-21 DIAGNOSIS — L400 Psoriasis vulgaris: Secondary | ICD-10-CM | POA: Diagnosis not present

## 2023-05-02 DIAGNOSIS — E782 Mixed hyperlipidemia: Secondary | ICD-10-CM | POA: Diagnosis not present

## 2023-05-02 DIAGNOSIS — N182 Chronic kidney disease, stage 2 (mild): Secondary | ICD-10-CM | POA: Diagnosis not present

## 2023-05-02 DIAGNOSIS — I25810 Atherosclerosis of coronary artery bypass graft(s) without angina pectoris: Secondary | ICD-10-CM | POA: Diagnosis not present

## 2023-05-02 DIAGNOSIS — I5020 Unspecified systolic (congestive) heart failure: Secondary | ICD-10-CM | POA: Diagnosis not present

## 2023-05-02 DIAGNOSIS — I10 Essential (primary) hypertension: Secondary | ICD-10-CM | POA: Diagnosis not present

## 2023-05-02 DIAGNOSIS — E1151 Type 2 diabetes mellitus with diabetic peripheral angiopathy without gangrene: Secondary | ICD-10-CM | POA: Diagnosis not present

## 2023-05-02 DIAGNOSIS — I739 Peripheral vascular disease, unspecified: Secondary | ICD-10-CM | POA: Diagnosis not present

## 2023-05-02 DIAGNOSIS — E1165 Type 2 diabetes mellitus with hyperglycemia: Secondary | ICD-10-CM | POA: Diagnosis not present

## 2023-05-02 DIAGNOSIS — E1121 Type 2 diabetes mellitus with diabetic nephropathy: Secondary | ICD-10-CM | POA: Diagnosis not present

## 2023-05-02 DIAGNOSIS — N528 Other male erectile dysfunction: Secondary | ICD-10-CM | POA: Diagnosis not present

## 2023-05-17 NOTE — Progress Notes (Signed)
 Patient ID: Justin Gonzales, male   DOB: [birth year], 68 y.o.   MRN: [id number]     68 y.o. CAD stent RCA then CABG 2007  On insulin for DM. Does work out at [employer] No angina. Compliant with meds Last myovue 2022 mostly inferior infarct EF 40%   Has son in [place]  and daughter in [place] Originally [place]   BP readings at home fine compliant with meds Coreg  increased by Pharm D 02/10/22   He finally got disability but for PTSD  He has gained a lot of weight when he went back home to [place] for 2 monts   Still heavy Activity limited by back   Was back in [place] for 2 months looking into [employer] pension Needs help with Entresto  patient assistance Volume seems up Has some cellulitis in LE;s due to chronic venous insufficiency and edema Lasix  increased to 40 mg daily 11/23/22   Edema now better has lost 6 lbs on [place]. Psoriasis much better Still more sedentary than he use to be  ROS: Denies fever, malais, weight loss, blurry vision, decreased visual acuity, cough, sputum, SOB, hemoptysis, pleuritic pain, palpitaitons, heartburn, abdominal pain, melena, lower extremity edema, claudication, or rash.  All other systems reviewed and negative  General:  There were no vitals taken for this visit.  Affect appropriate Overweight white male  HEENT: normal Neck supple with no adenopathy JVP normal no bruits no thyromegaly Lungs clear with no wheezing and good diaphragmatic motion Heart:  S1/S2 no murmur, no rub, gallop or click PMI normal Abdomen: benighn, BS positve, no tenderness, no AAA no bruit.  No HSM or HJR Distal pulses intact with no bruits Plus 2 bilateral  edema with echymotic discoloration  Neuro non-focal Skin psoriasis on back arms and legs  No muscular weakness   Allergies  Penicillin g and Penicillins  Electrocardiogram:   05/17/2023  SR rate 80 ? Old IMI IVCD lateral T wave changes    Assessment and Plan CAD/CABG:  2007 : Compliant with meds Myovue  09/24/20 inferior infarct EF 40% similar to 2014  Psoriasis:  Improved with green tea extract f/u dermatology Rx cellulitis 08/22/21 left leg   HTN:  Home readings fine some white coat component continue current meds weight loss important coreg  increased 02/2022   DM: Discussed low carb diet.  Target hemoglobin A1c is 6.5 or less.  Continue current medications.  Chol:  On statin labs with primary   Edema: dependant from obesity and venous reflux Continue lasix  40 mg and aldactone  25 mg   DCM:  Ischemic Continue entresto , aldactone , coreg  jardiance and lasix     Obesity:  discussed diet again Primary placed on on Mounjaro     F/U with me in a year    [employer]

## 2023-05-21 ENCOUNTER — Other Ambulatory Visit: Payer: Self-pay | Admitting: Cardiovascular Disease

## 2023-05-24 ENCOUNTER — Other Ambulatory Visit: Payer: Self-pay

## 2023-05-24 MED ORDER — CARVEDILOL 12.5 MG PO TABS: 12.5000 mg | ORAL_TABLET | Freq: Two times a day (BID) | ORAL | 1 refills | Status: AC

## 2023-05-25 ENCOUNTER — Encounter: Payer: Self-pay | Admitting: Cardiovascular Disease

## 2023-05-25 ENCOUNTER — Ambulatory Visit: Payer: HMO | Attending: Cardiovascular Disease | Admitting: Cardiovascular Disease

## 2023-05-25 VITALS — BP 146/70 | HR 98 | Ht 73.0 in | Wt 339.2 lb

## 2023-05-25 DIAGNOSIS — R600 Localized edema: Secondary | ICD-10-CM

## 2023-05-25 DIAGNOSIS — I2510 Atherosclerotic heart disease of native coronary artery without angina pectoris: Secondary | ICD-10-CM

## 2023-05-25 DIAGNOSIS — Z951 Presence of aortocoronary bypass graft: Secondary | ICD-10-CM | POA: Diagnosis not present

## 2023-05-25 DIAGNOSIS — I10 Essential (primary) hypertension: Secondary | ICD-10-CM | POA: Diagnosis not present

## 2023-05-25 NOTE — Patient Instructions (Signed)
 Medication Instructions:  Your physician recommends that you continue on your current medications as directed. Please refer to the Current Medication list given to you today.  *If you need a refill on your cardiac medications before your next appointment, please call your pharmacy*  Lab Work: If you have labs (blood work) drawn today and your tests are completely normal, you will receive your results only by: MyChart Message (if you have MyChart) OR A paper copy in the mail If you have any lab test that is abnormal or we need to change your treatment, we will call you to review the results.  Testing/Procedures: None ordered today.  Follow-Up: At [facility], you and your health needs are our priority.  As part of our continuing mission to provide you with exceptional heart care, our providers are all part of one team.  This team includes your primary Cardiologist (physician) and Advanced Practice Providers or APPs (Physician Assistants and Nurse Practitioners) who all work together to provide you with the care you need, when you need it.  Your next appointment:   12 month(s)  Provider:   Charlton Haws, MD    We recommend signing up for the patient portal called "MyChart".  Sign up information is provided on this After Visit Summary.  MyChart is used to connect with patients for Virtual Visits (Telemedicine).  Patients are able to view lab/test results, encounter notes, upcoming appointments, etc.  Non-urgent messages can be sent to your provider as well.   To learn more about what you can do with MyChart, go to [email or web address].   Other Instructions       1st Floor: - Lobby - Registration  - Pharmacy  - Lab - Cafe  2nd Floor: - PV Lab - Diagnostic Testing (echo, CT, nuclear med)  3rd Floor: - Vacant  4th Floor: - TCTS (cardiothoracic surgery) - AFib Clinic - Structural Heart Clinic - Vascular Surgery  - Vascular Ultrasound  5th Floor: - HeartCare  Cardiology (general and EP) - Clinical Pharmacy for coumadin, hypertension, lipid, weight-loss medications, and med management appointments    Valet parking services will be available as well.

## 2023-05-27 DIAGNOSIS — H2513 Age-related nuclear cataract, bilateral: Secondary | ICD-10-CM | POA: Diagnosis not present

## 2023-05-27 DIAGNOSIS — H40033 Anatomical narrow angle, bilateral: Secondary | ICD-10-CM | POA: Diagnosis not present

## 2023-06-28 DIAGNOSIS — E113511 Type 2 diabetes mellitus with proliferative diabetic retinopathy with macular edema, right eye: Secondary | ICD-10-CM | POA: Diagnosis not present

## 2023-07-06 DIAGNOSIS — I2510 Atherosclerotic heart disease of native coronary artery without angina pectoris: Secondary | ICD-10-CM | POA: Diagnosis not present

## 2023-07-06 DIAGNOSIS — N182 Chronic kidney disease, stage 2 (mild): Secondary | ICD-10-CM | POA: Diagnosis not present

## 2023-07-06 DIAGNOSIS — I10 Essential (primary) hypertension: Secondary | ICD-10-CM | POA: Diagnosis not present

## 2023-07-06 DIAGNOSIS — I872 Venous insufficiency (chronic) (peripheral): Secondary | ICD-10-CM | POA: Diagnosis not present

## 2023-07-06 DIAGNOSIS — I739 Peripheral vascular disease, unspecified: Secondary | ICD-10-CM | POA: Diagnosis not present

## 2023-07-06 DIAGNOSIS — I5020 Unspecified systolic (congestive) heart failure: Secondary | ICD-10-CM | POA: Diagnosis not present

## 2023-07-06 DIAGNOSIS — I119 Hypertensive heart disease without heart failure: Secondary | ICD-10-CM | POA: Diagnosis not present

## 2023-07-06 DIAGNOSIS — R809 Proteinuria, unspecified: Secondary | ICD-10-CM | POA: Diagnosis not present

## 2023-07-06 DIAGNOSIS — E782 Mixed hyperlipidemia: Secondary | ICD-10-CM | POA: Diagnosis not present

## 2023-07-06 DIAGNOSIS — E1121 Type 2 diabetes mellitus with diabetic nephropathy: Secondary | ICD-10-CM | POA: Diagnosis not present

## 2023-07-06 DIAGNOSIS — E1151 Type 2 diabetes mellitus with diabetic peripheral angiopathy without gangrene: Secondary | ICD-10-CM | POA: Diagnosis not present

## 2023-07-13 DIAGNOSIS — I5020 Unspecified systolic (congestive) heart failure: Secondary | ICD-10-CM | POA: Diagnosis not present

## 2023-07-13 DIAGNOSIS — E1151 Type 2 diabetes mellitus with diabetic peripheral angiopathy without gangrene: Secondary | ICD-10-CM | POA: Diagnosis not present

## 2023-07-13 DIAGNOSIS — I739 Peripheral vascular disease, unspecified: Secondary | ICD-10-CM | POA: Diagnosis not present

## 2023-07-13 DIAGNOSIS — I2510 Atherosclerotic heart disease of native coronary artery without angina pectoris: Secondary | ICD-10-CM | POA: Diagnosis not present

## 2023-07-13 DIAGNOSIS — R809 Proteinuria, unspecified: Secondary | ICD-10-CM | POA: Diagnosis not present

## 2023-07-13 DIAGNOSIS — I872 Venous insufficiency (chronic) (peripheral): Secondary | ICD-10-CM | POA: Diagnosis not present

## 2023-07-13 DIAGNOSIS — E1121 Type 2 diabetes mellitus with diabetic nephropathy: Secondary | ICD-10-CM | POA: Diagnosis not present

## 2023-07-13 DIAGNOSIS — E782 Mixed hyperlipidemia: Secondary | ICD-10-CM | POA: Diagnosis not present

## 2023-07-13 DIAGNOSIS — I10 Essential (primary) hypertension: Secondary | ICD-10-CM | POA: Diagnosis not present

## 2023-07-13 DIAGNOSIS — I119 Hypertensive heart disease without heart failure: Secondary | ICD-10-CM | POA: Diagnosis not present

## 2023-07-13 DIAGNOSIS — N182 Chronic kidney disease, stage 2 (mild): Secondary | ICD-10-CM | POA: Diagnosis not present

## 2023-07-14 DIAGNOSIS — L400 Psoriasis vulgaris: Secondary | ICD-10-CM | POA: Diagnosis not present

## 2023-08-10 ENCOUNTER — Other Ambulatory Visit: Payer: Self-pay | Admitting: Cardiovascular Disease

## 2023-09-06 DIAGNOSIS — E113412 Type 2 diabetes mellitus with severe nonproliferative diabetic retinopathy with macular edema, left eye: Secondary | ICD-10-CM | POA: Diagnosis not present

## 2023-09-06 DIAGNOSIS — E113511 Type 2 diabetes mellitus with proliferative diabetic retinopathy with macular edema, right eye: Secondary | ICD-10-CM | POA: Diagnosis not present

## 2023-09-06 DIAGNOSIS — H35372 Puckering of macula, left eye: Secondary | ICD-10-CM | POA: Diagnosis not present

## 2023-09-06 DIAGNOSIS — H35033 Hypertensive retinopathy, bilateral: Secondary | ICD-10-CM | POA: Diagnosis not present

## 2023-09-06 DIAGNOSIS — Z961 Presence of intraocular lens: Secondary | ICD-10-CM | POA: Diagnosis not present

## 2023-09-06 DIAGNOSIS — H4311 Vitreous hemorrhage, right eye: Secondary | ICD-10-CM | POA: Diagnosis not present

## 2023-10-15 ENCOUNTER — Other Ambulatory Visit: Payer: Self-pay | Admitting: Cardiovascular Disease

## 2023-10-18 DIAGNOSIS — L400 Psoriasis vulgaris: Secondary | ICD-10-CM | POA: Diagnosis not present

## 2023-10-20 DIAGNOSIS — E261 Secondary hyperaldosteronism: Secondary | ICD-10-CM | POA: Diagnosis not present

## 2023-10-20 DIAGNOSIS — L4050 Arthropathic psoriasis, unspecified: Secondary | ICD-10-CM | POA: Diagnosis not present

## 2023-10-20 DIAGNOSIS — Z6841 Body Mass Index (BMI) 40.0 and over, adult: Secondary | ICD-10-CM | POA: Diagnosis not present

## 2023-10-20 DIAGNOSIS — I209 Angina pectoris, unspecified: Secondary | ICD-10-CM | POA: Diagnosis not present

## 2023-10-20 DIAGNOSIS — E1169 Type 2 diabetes mellitus with other specified complication: Secondary | ICD-10-CM | POA: Diagnosis not present

## 2023-10-20 DIAGNOSIS — G8929 Other chronic pain: Secondary | ICD-10-CM | POA: Diagnosis not present

## 2023-10-20 DIAGNOSIS — E782 Mixed hyperlipidemia: Secondary | ICD-10-CM | POA: Diagnosis not present

## 2023-10-20 DIAGNOSIS — Z794 Long term (current) use of insulin: Secondary | ICD-10-CM | POA: Diagnosis not present

## 2023-10-20 DIAGNOSIS — F419 Anxiety disorder, unspecified: Secondary | ICD-10-CM | POA: Diagnosis not present

## 2023-10-20 DIAGNOSIS — I110 Hypertensive heart disease with heart failure: Secondary | ICD-10-CM | POA: Diagnosis not present

## 2023-10-20 DIAGNOSIS — I509 Heart failure, unspecified: Secondary | ICD-10-CM | POA: Diagnosis not present

## 2023-11-01 ENCOUNTER — Other Ambulatory Visit: Payer: Self-pay | Admitting: Cardiovascular Disease

## 2023-11-01 DIAGNOSIS — I10 Essential (primary) hypertension: Secondary | ICD-10-CM | POA: Diagnosis not present

## 2023-11-01 DIAGNOSIS — E782 Mixed hyperlipidemia: Secondary | ICD-10-CM | POA: Diagnosis not present

## 2023-11-01 DIAGNOSIS — I2510 Atherosclerotic heart disease of native coronary artery without angina pectoris: Secondary | ICD-10-CM | POA: Diagnosis not present

## 2023-11-01 DIAGNOSIS — E1165 Type 2 diabetes mellitus with hyperglycemia: Secondary | ICD-10-CM | POA: Diagnosis not present

## 2023-11-03 ENCOUNTER — Other Ambulatory Visit: Payer: Self-pay | Admitting: Cardiovascular Disease

## 2023-11-04 MED ORDER — SACUBITRIL-VALSARTAN 97-103 MG PO TABS: 1.0000 | ORAL_TABLET | Freq: Two times a day (BID) | ORAL | 2 refills | Status: DC

## 2023-11-29 DIAGNOSIS — E113511 Type 2 diabetes mellitus with proliferative diabetic retinopathy with macular edema, right eye: Secondary | ICD-10-CM | POA: Diagnosis not present

## 2023-12-13 DIAGNOSIS — L4059 Other psoriatic arthropathy: Secondary | ICD-10-CM | POA: Diagnosis not present

## 2023-12-13 DIAGNOSIS — L400 Psoriasis vulgaris: Secondary | ICD-10-CM | POA: Diagnosis not present

## 2024-01-02 ENCOUNTER — Other Ambulatory Visit: Payer: Self-pay | Admitting: Cardiovascular Disease

## 2024-01-06 ENCOUNTER — Other Ambulatory Visit: Payer: Self-pay | Admitting: Cardiovascular Disease

## 2024-01-06 ENCOUNTER — Encounter: Payer: Self-pay | Admitting: Cardiovascular Disease

## 2024-01-09 ENCOUNTER — Other Ambulatory Visit (HOSPITAL_COMMUNITY): Payer: Self-pay

## 2024-01-09 ENCOUNTER — Telehealth: Payer: Self-pay | Admitting: Pharmacy Technician

## 2024-01-09 NOTE — Telephone Encounter (Signed)
 Entresto  no longer available on [employer]. Got hw grant  Patient Advocate Encounter   The patient was approved for a [employer] grant that will help cover the cost of entresto  Total amount awarded, 7500.  Effective: 12/10/23 - 12/08/24   APW:[id number] ERW:[id number] Hmnle:[id number] PI:[id number] [employer] ID: [id number]   Pharmacy provided with approval and processing information. Patient informed via mychart   [facility] called [facility] and they are having trouble rebilling this from 3 days ago.

## 2024-01-10 DIAGNOSIS — L400 Psoriasis vulgaris: Secondary | ICD-10-CM | POA: Diagnosis not present

## 2024-01-11 DIAGNOSIS — N182 Chronic kidney disease, stage 2 (mild): Secondary | ICD-10-CM | POA: Diagnosis not present

## 2024-01-11 DIAGNOSIS — I739 Peripheral vascular disease, unspecified: Secondary | ICD-10-CM | POA: Diagnosis not present

## 2024-01-11 DIAGNOSIS — I119 Hypertensive heart disease without heart failure: Secondary | ICD-10-CM | POA: Diagnosis not present

## 2024-01-11 DIAGNOSIS — I872 Venous insufficiency (chronic) (peripheral): Secondary | ICD-10-CM | POA: Diagnosis not present

## 2024-01-11 DIAGNOSIS — E1121 Type 2 diabetes mellitus with diabetic nephropathy: Secondary | ICD-10-CM | POA: Diagnosis not present

## 2024-01-11 DIAGNOSIS — I10 Essential (primary) hypertension: Secondary | ICD-10-CM | POA: Diagnosis not present

## 2024-01-11 DIAGNOSIS — I5020 Unspecified systolic (congestive) heart failure: Secondary | ICD-10-CM | POA: Diagnosis not present

## 2024-01-11 DIAGNOSIS — I2510 Atherosclerotic heart disease of native coronary artery without angina pectoris: Secondary | ICD-10-CM | POA: Diagnosis not present

## 2024-01-11 DIAGNOSIS — R809 Proteinuria, unspecified: Secondary | ICD-10-CM | POA: Diagnosis not present

## 2024-01-11 DIAGNOSIS — E782 Mixed hyperlipidemia: Secondary | ICD-10-CM | POA: Diagnosis not present

## 2024-01-11 MED ORDER — SACUBITRIL-VALSARTAN 97-103 MG PO TABS: 1.0000 | ORAL_TABLET | Freq: Two times a day (BID) | ORAL | 1 refills | Status: AC

## 2024-01-19 ENCOUNTER — Other Ambulatory Visit: Payer: Self-pay | Admitting: Cardiovascular Disease

## 2024-02-27 ENCOUNTER — Other Ambulatory Visit: Payer: Self-pay | Admitting: Cardiovascular Disease

## 2024-03-01 NOTE — Telephone Encounter (Signed)
 Patient  had CMET  on 02/01/24. Everything was in normal range except for glucose. Pharmacy requested 20 mg, but patient is taking lasix  40 mg daily. Called patient to make sure he has been taking 40 mg. Patient stated yes, that he has been taking lasix  40 mg daily. Will refill his medication with what he is currently taking and what Dr. Delford had stated in his office note.

## 2024-05-24 ENCOUNTER — Ambulatory Visit: Admitting: Cardiovascular Disease
